# Patient Record
Sex: Female | Born: 1965 | Race: White | Hispanic: No | Marital: Married | Smoking: Never smoker
Health system: Southern US, Community
[De-identification: ages and names within clinical notes are randomized; demographics above are authoritative.]

---

## 2021-06-30 ENCOUNTER — Other Ambulatory Visit: Payer: Self-pay

## 2021-06-30 ENCOUNTER — Inpatient Hospital Stay (HOSPITAL_BASED_OUTPATIENT_CLINIC_OR_DEPARTMENT_OTHER)
Admission: EM | Admit: 2021-06-30 | Discharge: 2021-07-08 | DRG: 025 | Disposition: A | Discharge status: Home / Self Care | Payer: 59 | Attending: Neurological Surgery | Admitting: Neurological Surgery

## 2021-06-30 ENCOUNTER — Emergency Department (HOSPITAL_BASED_OUTPATIENT_CLINIC_OR_DEPARTMENT_OTHER): Payer: 59

## 2021-06-30 ENCOUNTER — Encounter (HOSPITAL_BASED_OUTPATIENT_CLINIC_OR_DEPARTMENT_OTHER): Payer: Self-pay

## 2021-06-30 DIAGNOSIS — G4452 New daily persistent headache (NDPH): Secondary | ICD-10-CM

## 2021-06-30 DIAGNOSIS — D496 Neoplasm of unspecified behavior of brain: Principal | ICD-10-CM | POA: Diagnosis present

## 2021-06-30 DIAGNOSIS — R4701 Aphasia: Secondary | ICD-10-CM | POA: Diagnosis present

## 2021-06-30 DIAGNOSIS — Z91040 Latex allergy status: Secondary | ICD-10-CM | POA: Diagnosis not present

## 2021-06-30 DIAGNOSIS — Z79890 Hormone replacement therapy (postmenopausal): Secondary | ICD-10-CM

## 2021-06-30 DIAGNOSIS — Z882 Allergy status to sulfonamides status: Secondary | ICD-10-CM | POA: Diagnosis not present

## 2021-06-30 DIAGNOSIS — E876 Hypokalemia: Secondary | ICD-10-CM | POA: Diagnosis not present

## 2021-06-30 DIAGNOSIS — G9389 Other specified disorders of brain: Principal | ICD-10-CM

## 2021-06-30 DIAGNOSIS — Z888 Allergy status to other drugs, medicaments and biological substances status: Secondary | ICD-10-CM | POA: Diagnosis not present

## 2021-06-30 DIAGNOSIS — Z880 Allergy status to penicillin: Secondary | ICD-10-CM | POA: Diagnosis not present

## 2021-06-30 DIAGNOSIS — G936 Cerebral edema: Secondary | ICD-10-CM | POA: Diagnosis not present

## 2021-06-30 DIAGNOSIS — H53461 Homonymous bilateral field defects, right side: Secondary | ICD-10-CM | POA: Diagnosis not present

## 2021-06-30 DIAGNOSIS — C719 Malignant neoplasm of brain, unspecified: Secondary | ICD-10-CM

## 2021-06-30 DIAGNOSIS — N390 Urinary tract infection, site not specified: Secondary | ICD-10-CM | POA: Diagnosis not present

## 2021-06-30 LAB — CBC WITH DIFFERENTIAL/PLATELET
Abs Immature Granulocytes: 0.02 10*3/uL (ref 0.00–0.07)
Basophils Absolute: 0.1 10*3/uL (ref 0.0–0.1)
Basophils Relative: 1 %
Eosinophils Absolute: 0.1 10*3/uL (ref 0.0–0.5)
Eosinophils Relative: 1 %
HCT: 39.2 % (ref 36.0–46.0)
Hemoglobin: 13 g/dL (ref 12.0–15.0)
Immature Granulocytes: 0 %
Lymphocytes Relative: 19 %
Lymphs Abs: 1.6 10*3/uL (ref 0.7–4.0)
MCH: 30.2 pg (ref 26.0–34.0)
MCHC: 33.2 g/dL (ref 30.0–36.0)
MCV: 91 fL (ref 80.0–100.0)
Monocytes Absolute: 0.6 10*3/uL (ref 0.1–1.0)
Monocytes Relative: 7 %
Neutro Abs: 5.8 10*3/uL (ref 1.7–7.7)
Neutrophils Relative %: 72 %
Platelets: 193 10*3/uL (ref 150–400)
RBC: 4.31 MIL/uL (ref 3.87–5.11)
RDW: 11.6 % (ref 11.5–15.5)
WBC: 8.1 10*3/uL (ref 4.0–10.5)
nRBC: 0 % (ref 0.0–0.2)

## 2021-06-30 LAB — BASIC METABOLIC PANEL
Anion gap: 11 (ref 5–15)
BUN: 17 mg/dL (ref 6–20)
CO2: 25 mmol/L (ref 22–32)
Calcium: 9.6 mg/dL (ref 8.9–10.3)
Chloride: 103 mmol/L (ref 98–111)
Creatinine, Ser: 0.83 mg/dL (ref 0.44–1.00)
GFR, Estimated: >60 mL/min (ref >60)
Glucose, Bld: 145 mg/dL — ABNORMAL HIGH (ref 70–99)
Potassium: 3.6 mmol/L (ref 3.5–5.1)
Sodium: 139 mmol/L (ref 135–145)

## 2021-06-30 IMAGING — CT CT HEAD W/O CM
4 series · 16 of 47 positions shown, 18 images · non-contrast
Comparison: None Available.

CLINICAL DATA: Neurologic deficit.



[Series 2: head wo · axial · 0.41mm/px · z∈[-123,-3]mm · 7 of 33 slices shown, 9 images]
[im 5/33  brain]
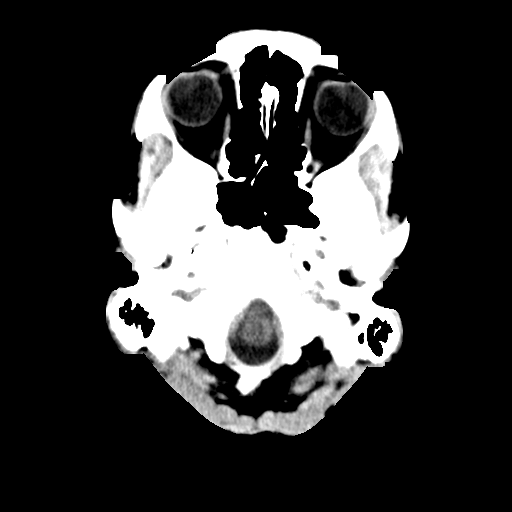
[im 5/33  bone]
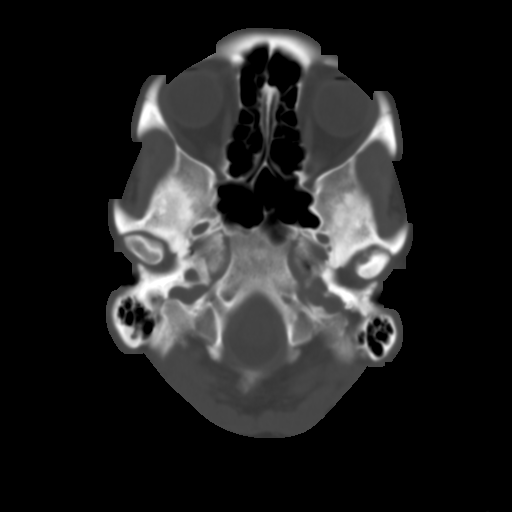
[im 9/33  brain]
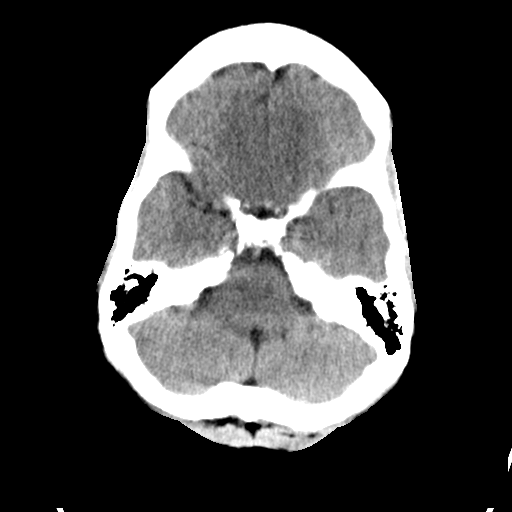
[im 13/33  brain]
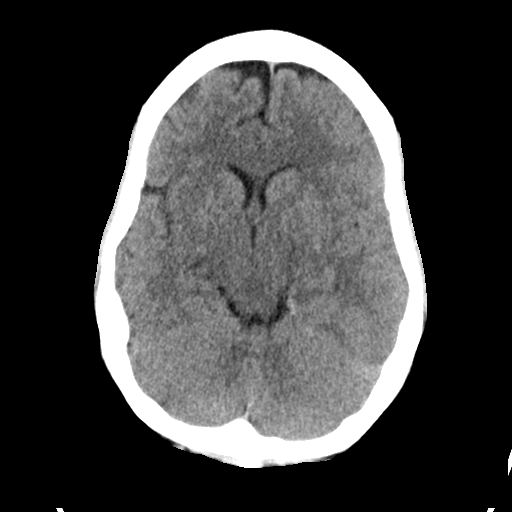
[im 17/33  brain]
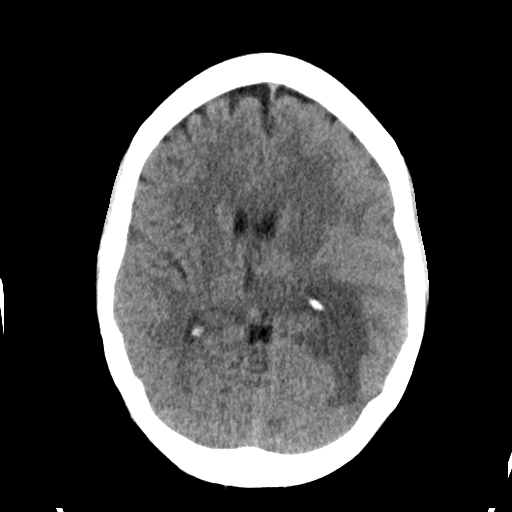
[im 21/33  brain]
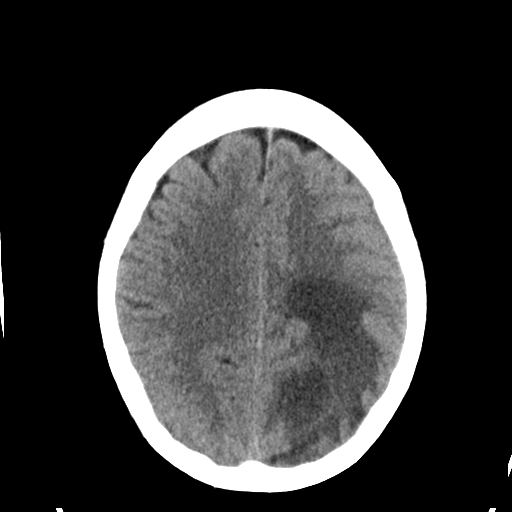
[im 21/33  bone]
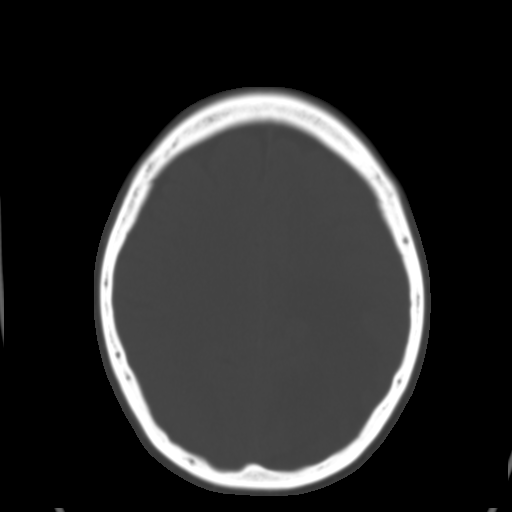
[im 25/33  brain]
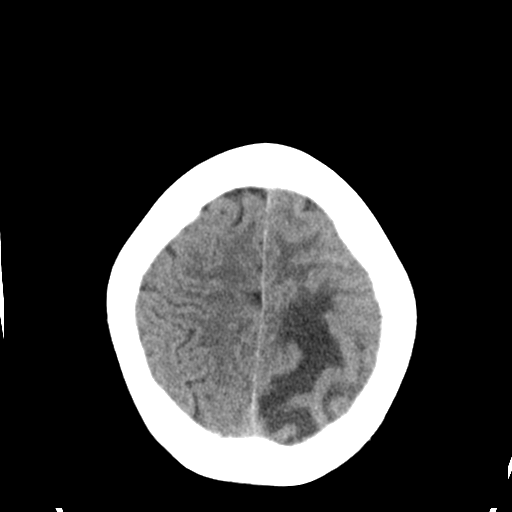
[im 29/33  brain]
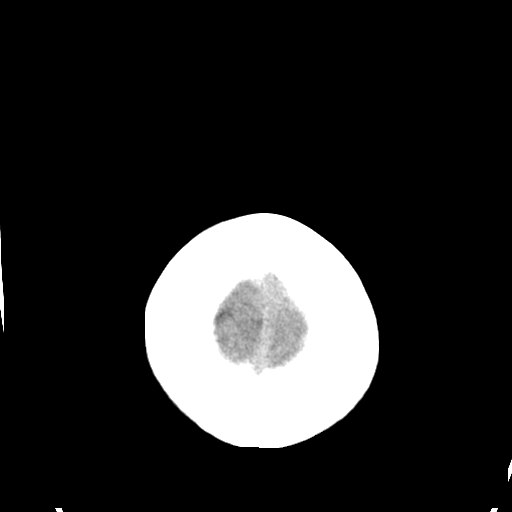

[Series 3: head bone · axial · 0.41mm/px · z∈[-127,-95]mm · 3 of 81 slices shown]
[im 9/81  bone]
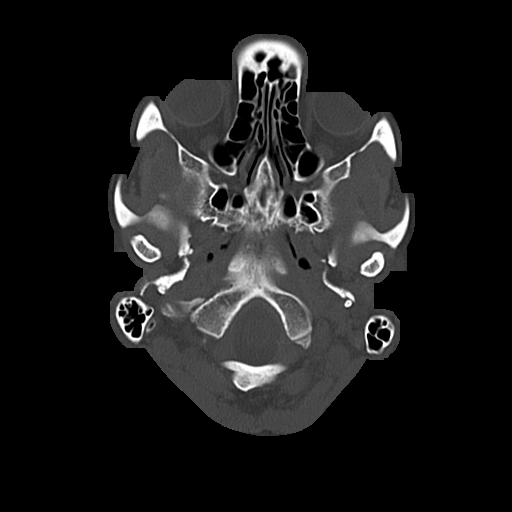
[im 17/81  bone]
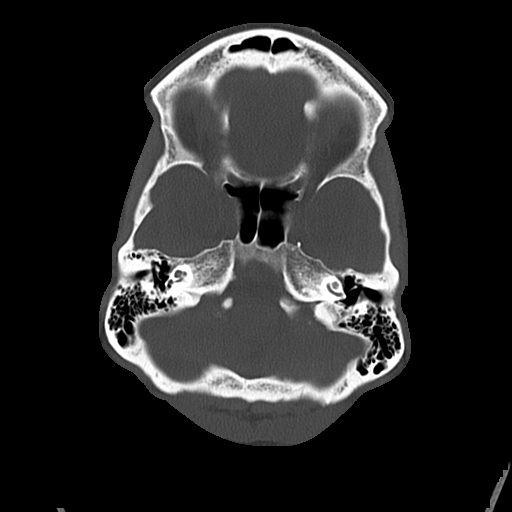
[im 25/81  bone]
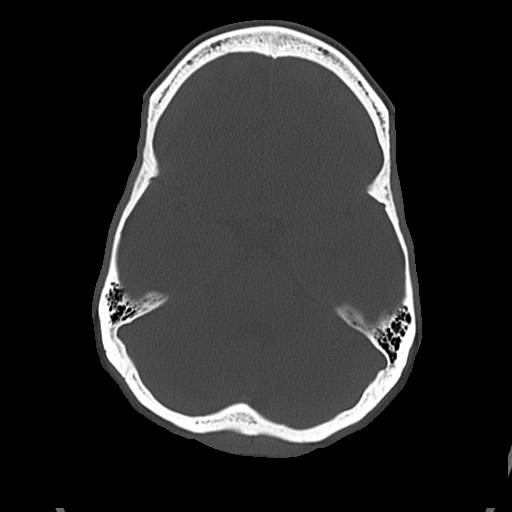

[Series 4: coronal soft · coronal · 0.29mm/px · 3 of 64 slices shown]
[im 22/64  brain]
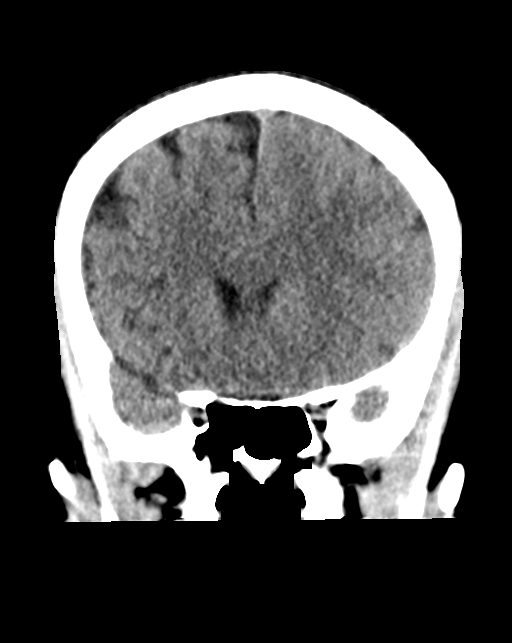
[im 29/64  brain]
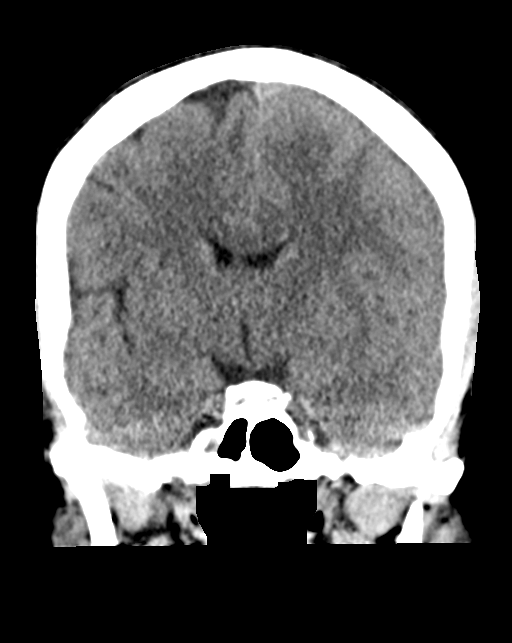
[im 36/64  brain]
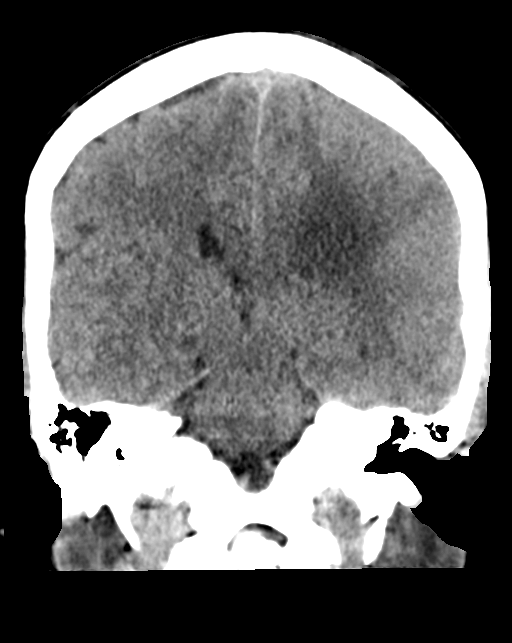

[Series 5: sagittal soft · sagittal · 0.36mm/px · 3 of 50 slices shown]
[im 17/50  brain]
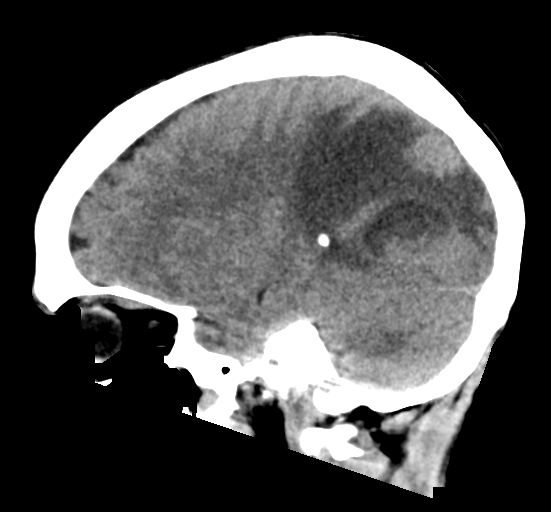
[im 25/50  brain]
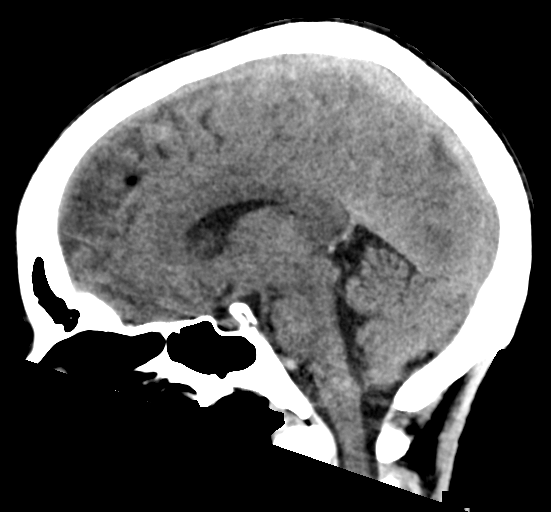
[im 33/50  brain]
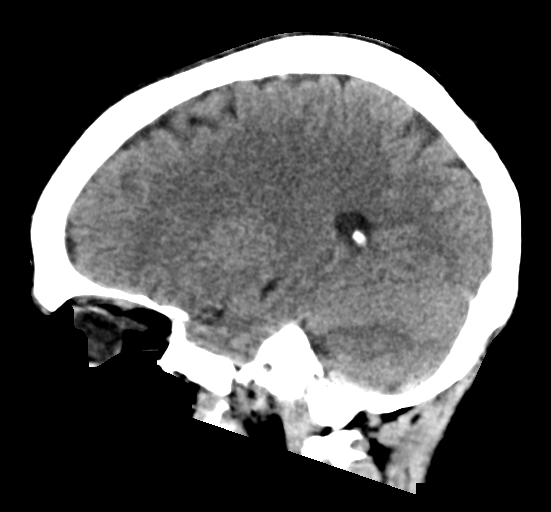

[16 of 47 positions shown; findings below may reference images not displayed]

FINDINGS: Brain: There is a large area of vasogenic edema involving the left
posterior frontal, parietal, and occipital lobes. And ill-defined
low attenuating rounded lesion in the medial left occipital lobe
([DATE]) suspicious for a mass. Further characterization with MRI
without and with contrast is recommended. There is associated mild
mass effect and compression of the left lateral ventricle and
adjacent sulci. There is approximately 3 mm left right midline
shift. There is no acute intracranial hemorrhage.

Vascular: No hyperdense vessel or unexpected calcification.

Skull: Normal. Negative for fracture or focal lesion.

Sinuses/Orbits: No acute finding.

Other: None
IMPRESSION: 1. No acute intracranial hemorrhage.
2. Large left vasogenic edema with associated mass effect and 3 mm
left-to-right midline shift and findings concerning for underlying
neoplasm. Further characterization with MRI without and with
contrast is recommended.

These results were called by telephone at the time of interpretation
on [DATE] at [DATE] to provider RANTONA , who verbally
acknowledged these results.

## 2021-06-30 MED ORDER — DEXAMETHASONE SODIUM PHOSPHATE 10 MG/ML IJ SOLN
10.0000 mg | Freq: Once | INTRAMUSCULAR | Status: AC
  Administered 2021-06-30: 10 mg via INTRAVENOUS
  Filled 2021-06-30: qty 1

## 2021-06-30 NOTE — Progress Notes (Signed)
We were paged to look at the CT scan on this 56 year old female who presented with a 2-week history of headaches.  I have called the #3 times with no answer and cannot leave a message because the mailbox has not been set up.  Addendum: Dr Almyra Free just called me back and we discussed this patient ? ?CT scan shows edema in the left occipital lobe consistent with mass lesion.  Suspect primary brain tumor versus metastatic lesion.  An MRI has been ordered but I do not think they can do that at that facility. ? ?I recommend transfer to [facility] for MRI of the brain with and without contrast, admission to the medicine service for further work-up and management.  10 mg of Decadron followed by 4 mg of Decadron every 6 hours.  This may help cerebral edema somewhat.  If the MRI looks like GBM then no metastatic work-up needed, if the MRI looks like brain metastasis then metastatic work-up will be needed. ?

## 2021-06-30 NOTE — ED Triage Notes (Addendum)
Pt presents to the ED with husband. Reports having a sharp pain in her head two weeks ago. Since she reports having issues reading and writing. Reports occasionally stumbling. EKG obtained. MD evaluated pt in triage. [facility] 1. ?

## 2021-06-30 NOTE — ED Provider Notes (Signed)
?[facility] ?Provider Note ? ? ?CSN: [id number] ?Arrival date & time: 06/30/21  1911 ? ?  ? ?History ? ?Chief Complaint  ?Patient presents with  ? Aphasia  ? ? ?Debbie Horton is a 56 y.o. female. ? ?Presents to ER chief complaint of being a bit "off" for the past week or 2.  She found she was stumbling at times, had some difficulty spelling out certain words.  Otherwise denies any specific weakness.  Denies any actual speaking difficulty.  However symptoms of gotten worse in the last 3 days.  Coworkers noticed that something was off about her and advised her to go to the ER.  Patient otherwise denies any other medical history.  She is on a hormone replacement therapy with her OB/GYN physician, otherwise does not have a regular primary care doctor. ? ? ?  ? ?Home Medications ?Prior to Admission medications   ?Not on File  ?   ? ?Allergies    ?Penicillins, Phenergan [promethazine], and Sulfa antibiotics   ? ?Review of Systems   ?Review of Systems  ?Constitutional:  Negative for fever.  ?HENT:  Negative for ear pain.   ?Eyes:  Negative for pain.  ?Respiratory:  Negative for cough.   ?Cardiovascular:  Negative for chest pain.  ?Gastrointestinal:  Negative for abdominal pain.  ?Genitourinary:  Negative for flank pain.  ?Musculoskeletal:  Negative for back pain.  ?Skin:  Negative for rash.  ?Neurological:  Negative for headaches.  ? ?Physical Exam ?Updated Vital Signs ?BP 124/78 (BP Location: Right Arm)   Pulse (!) 104   Temp 98.9 ?F (37.2 ?C)   Resp 18   Ht '5\' 3"'$  (1.6 m)   Wt 59 kg   SpO2 100%   BMI 23.03 kg/m?  ?Physical Exam ?Constitutional:   ?   General: She is not in acute distress. ?   Appearance: Normal appearance.  ?HENT:  ?   Head: Normocephalic.  ?   Nose: Nose normal.  ?Eyes:  ?   Extraocular Movements: Extraocular movements intact.  ?Cardiovascular:  ?   Rate and Rhythm: Normal rate.  ?Pulmonary:  ?   Effort: Pulmonary effort is normal.  ?Musculoskeletal:     ?   General:  Normal range of motion.  ?   Cervical back: Normal range of motion.  ?Neurological:  ?   Mental Status: She is alert and oriented to person, place, and time.  ?   Comments: Patient is awake and alert and oriented answers all my questions appropriately speech pattern is normal no facial droop noted. ? ?Strength is 5/5 all extremities. ? ?Patient is able to walk unassisted.  Has a mildly unstable gait. ? ?On visual field exam she has right-sided visual field neglect.  ? ? ?ED Results / Procedures / Treatments   ?Labs ?(all labs ordered are listed, but only abnormal results are displayed) ?Labs Reviewed  ?BASIC METABOLIC PANEL - Abnormal; Notable for the following components:  ?    Result Value  ? Glucose, Bld 145 (*)   ? All other components within normal limits  ?CBC WITH DIFFERENTIAL/PLATELET  ? ? ?EKG ?EKG Interpretation ? ?Date/Time:  Wednesday Jun 30 2021 19:38:09 EDT ?Ventricular Rate:  97 ?PR Interval:  126 ?QRS Duration: 70 ?QT Interval:  318 ?QTC Calculation: 403 ?R Axis:   69 ?Text Interpretation: Normal sinus rhythm Right atrial enlargement Nonspecific ST and T wave abnormality Abnormal ECG No previous ECGs available Confirmed by Thamas Jaegers ([id number]) on 06/30/2021 7:50:25 PM ? ?  Radiology ?CT Head Wo Contrast ? ?Result Date: 06/30/2021 ?CLINICAL DATA:  Neurologic deficit. EXAM: CT HEAD WITHOUT CONTRAST TECHNIQUE: Contiguous axial images were obtained from the base of the skull through the vertex without intravenous contrast. RADIATION DOSE REDUCTION: This exam was performed according to the departmental dose-optimization program which includes automated exposure control, adjustment of the mA and/or kV according to patient size and/or use of iterative reconstruction technique. COMPARISON:  None Available. FINDINGS: Brain: There is a large area of vasogenic edema involving the left posterior frontal, parietal, and occipital lobes. And ill-defined low attenuating rounded lesion in the medial left occipital lobe  (21/2) suspicious for a mass. Further characterization with MRI without and with contrast is recommended. There is associated mild mass effect and compression of the left lateral ventricle and adjacent sulci. There is approximately 3 mm left right midline shift. There is no acute intracranial hemorrhage. Vascular: No hyperdense vessel or unexpected calcification. Skull: Normal. Negative for fracture or focal lesion. Sinuses/Orbits: No acute finding. Other: None IMPRESSION: 1. No acute intracranial hemorrhage. 2. Large left vasogenic edema with associated mass effect and 3 mm left-to-right midline shift and findings concerning for underlying neoplasm. Further characterization with MRI without and with contrast is recommended. These results were called by telephone at the time of interpretation on 06/30/2021 at 8:27 pm to provider [facility] , who verbally acknowledged these results. Electronically Signed   By: Anner Crete M.D.   On: 06/30/2021 20:28   ? ?Procedures ?Procedures  ? ? ?Medications Ordered in ED ?Medications  ?dexamethasone (DECADRON) injection 10 mg (has no administration in time range)  ? ? ?ED Course/ Medical Decision Making/ A&P ?  ?                        ?Medical Decision Making ?Amount and/or Complexity of Data Reviewed ?Labs: ordered. ?Radiology: ordered. ? ?Risk ?Prescription drug management. ? ? ?No additional medical records found. ? ?Cardiac monitoring showing sinus rhythm. ? ?History obtained from husband at bedside. ? ?Work-up includes labs which were unremarkable.  However CT scan of the brain concerning for large amount of vasogenic edema in the left side of the brain with underlying mass.  There is a 3 to 5 mm midline shift per radiologist.  Case discussed with radiology. ? ?Case also discussed with on-call neurosurgeon Dr.Jones recommending Decadron 10 mg now and admission to the hospitalist team. ? ?MRI with and without of the brain ordered and pending transfer to  hospital. ? ?Case discussed with medicine for admission. ? ? ? ? ? ? ? ?Final Clinical Impression(s) / ED Diagnoses ?Final diagnoses:  ?Brain mass  ? ? ?Rx / DC Orders ?ED Discharge Orders   ? ? None  ? ?  ? ? ?  ?Luna Fuse, MD ?06/30/21 2131 ? ?

## 2021-07-01 ENCOUNTER — Observation Stay (HOSPITAL_COMMUNITY): Payer: 59

## 2021-07-01 DIAGNOSIS — Z91040 Latex allergy status: Secondary | ICD-10-CM | POA: Diagnosis not present

## 2021-07-01 DIAGNOSIS — R4701 Aphasia: Secondary | ICD-10-CM | POA: Diagnosis present

## 2021-07-01 DIAGNOSIS — Z79890 Hormone replacement therapy (postmenopausal): Secondary | ICD-10-CM | POA: Diagnosis not present

## 2021-07-01 DIAGNOSIS — Z888 Allergy status to other drugs, medicaments and biological substances status: Secondary | ICD-10-CM | POA: Diagnosis not present

## 2021-07-01 DIAGNOSIS — N390 Urinary tract infection, site not specified: Secondary | ICD-10-CM | POA: Diagnosis present

## 2021-07-01 DIAGNOSIS — G9389 Other specified disorders of brain: Secondary | ICD-10-CM

## 2021-07-01 DIAGNOSIS — Z882 Allergy status to sulfonamides status: Secondary | ICD-10-CM | POA: Diagnosis not present

## 2021-07-01 DIAGNOSIS — H53461 Homonymous bilateral field defects, right side: Secondary | ICD-10-CM | POA: Diagnosis present

## 2021-07-01 DIAGNOSIS — D496 Neoplasm of unspecified behavior of brain: Secondary | ICD-10-CM | POA: Diagnosis not present

## 2021-07-01 DIAGNOSIS — Z880 Allergy status to penicillin: Secondary | ICD-10-CM | POA: Diagnosis not present

## 2021-07-01 DIAGNOSIS — E876 Hypokalemia: Secondary | ICD-10-CM | POA: Diagnosis present

## 2021-07-01 DIAGNOSIS — G936 Cerebral edema: Secondary | ICD-10-CM | POA: Diagnosis present

## 2021-07-01 LAB — BASIC METABOLIC PANEL
Anion gap: 7 (ref 5–15)
BUN: 11 mg/dL (ref 6–20)
CO2: 24 mmol/L (ref 22–32)
Calcium: 9.2 mg/dL (ref 8.9–10.3)
Chloride: 108 mmol/L (ref 98–111)
Creatinine, Ser: 0.76 mg/dL (ref 0.44–1.00)
GFR, Estimated: >60 mL/min (ref >60)
Glucose, Bld: 151 mg/dL — ABNORMAL HIGH (ref 70–99)
Potassium: 3.4 mmol/L — ABNORMAL LOW (ref 3.5–5.1)
Sodium: 139 mmol/L (ref 135–145)

## 2021-07-01 LAB — CBC
HCT: 39.8 % (ref 36.0–46.0)
Hemoglobin: 13.3 g/dL (ref 12.0–15.0)
MCH: 30.5 pg (ref 26.0–34.0)
MCHC: 33.4 g/dL (ref 30.0–36.0)
MCV: 91.3 fL (ref 80.0–100.0)
Platelets: 194 10*3/uL (ref 150–400)
RBC: 4.36 MIL/uL (ref 3.87–5.11)
RDW: 11.5 % (ref 11.5–15.5)
WBC: 8.2 10*3/uL (ref 4.0–10.5)
nRBC: 0 % (ref 0.0–0.2)

## 2021-07-01 LAB — HIV ANTIBODY (ROUTINE TESTING W REFLEX): HIV Screen 4th Generation wRfx: NONREACTIVE

## 2021-07-01 IMAGING — MR MR HEAD WO/W CM
16 of 19 series · 36 of 48 positions shown · IV contrast (gadavist)
Comparison: Head CT [DATE]

CLINICAL DATA: Intracranial neoplasm

EXAM:
MRI HEAD WITHOUT AND WITH CONTRAST
TECHNIQUE: Multiplanar, multiecho pulse sequences of the brain and surrounding
structures were obtained without and with intravenous contrast.
CONTRAST:  6mL GADAVIST GADOBUTROL 1 MMOL/ML IV SOLN

[Series 5: DWI · axial · 3.0mm · 0.88mm/px · z∈[-107,+54]mm · 5 of 113 slices shown (1 of 4)]
[im 1/113]
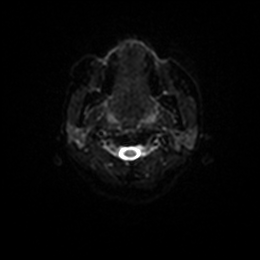
[im 29/113]
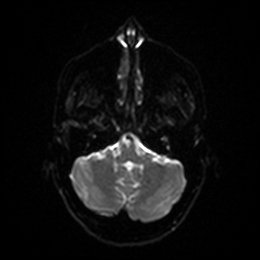
[im 57/113]
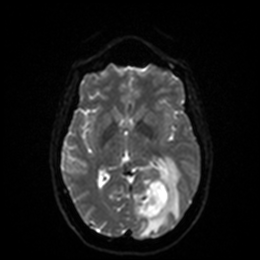
[im 85/113]
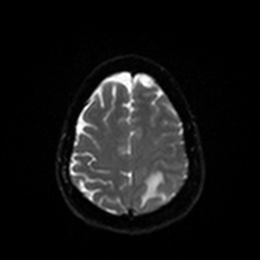
[im 113/113]
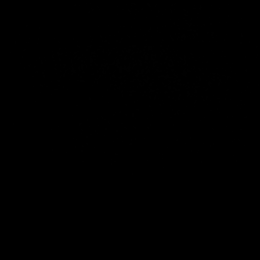

[Series 6: DWI · axial · 3.0mm · 0.88mm/px · z∈[-107,+51]mm · 2 of 56 slices shown (2 of 4)]
[im 1/56]
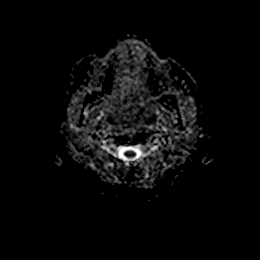
[im 56/56]
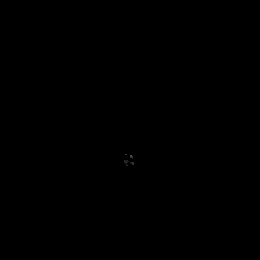

[Series 7: DWI · coronal · 4.0mm · 0.88mm/px · 3 of 80 slices shown (3 of 4)]
[im 1/80]
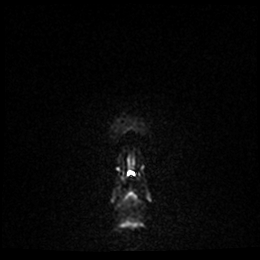
[im 40/80]
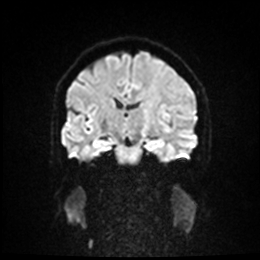
[im 80/80]
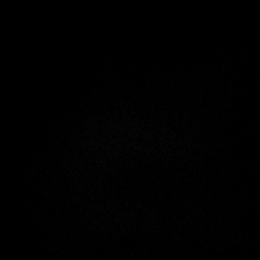

[Series 8: DWI · coronal · 4.0mm · 0.88mm/px · 2 of 39 slices shown (4 of 4)]
[im 1/39]
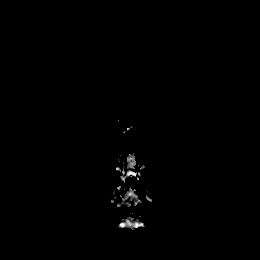
[im 39/39]
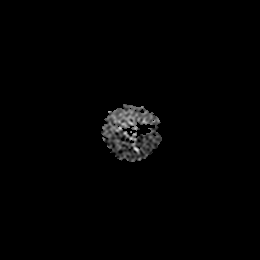

[Series 9: T1 · sagittal · 5.0mm · 0.75mm/px · 1 of 27 slices shown]
[im 1/27]
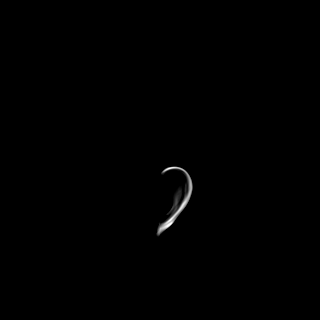

[Series 10: T2 · axial · 5.0mm · 0.72mm/px · 1 of 29 slices shown]
[im 1/29]
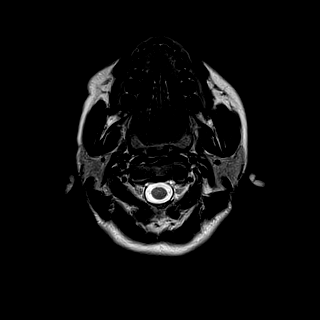

[Series 11: FLAIR · axial · 5.0mm · 0.45mm/px · 1 of 29 slices shown]
[im 1/29]
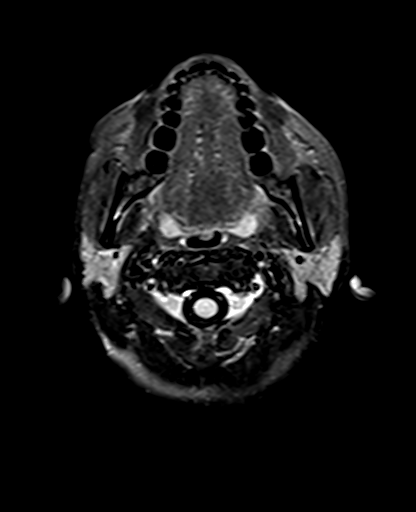

[Series 12: mag_images · axial · 3.0mm · 0.90mm/px · z∈[-103,+55]mm · 2 of 56 slices shown]
[im 1/56]
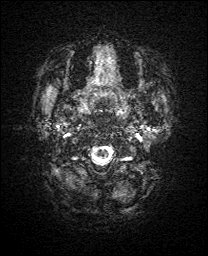
[im 56/56]
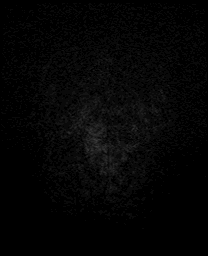

[Series 13: pha_images · axial · 3.0mm · 0.90mm/px · z∈[-103,+49]mm · 2 of 53 slices shown]
[im 1/53]
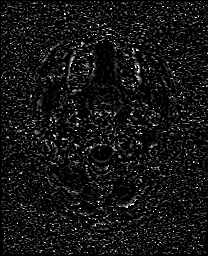
[im 53/53]
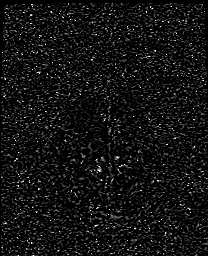

[Series 14: swi_images · axial · 3.0mm · 0.90mm/px · z∈[-103,+55]mm · 2 of 56 slices shown]
[im 1/56]
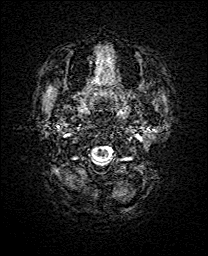
[im 56/56]
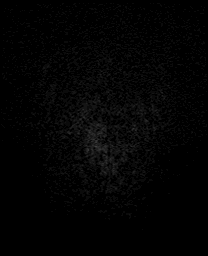

[Series 15: mip_images(sw) · axial · 24.0mm · 0.90mm/px · z∈[-93,+45]mm · 2 of 49 slices shown]
[im 1/49]
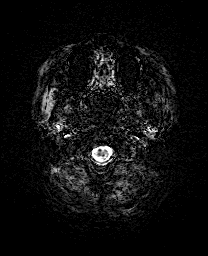
[im 49/49]
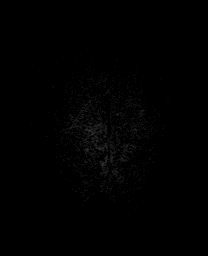

[Series 17: t2_space_dark-fluid_sag_p2_ns-ir · sagittal · 1.0mm · 0.49mm/px · 8 of 192 slices shown]
[im 1/192]
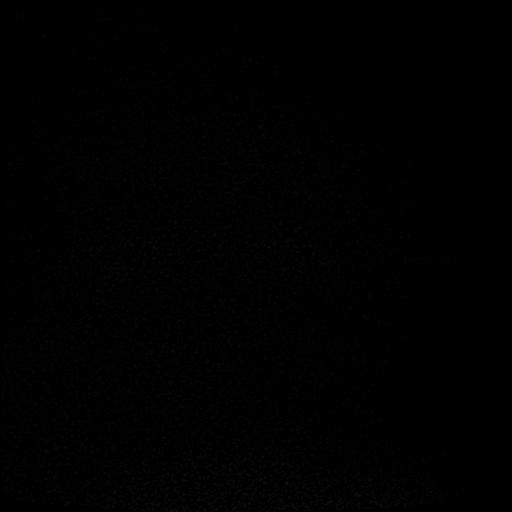
[im 28/192]
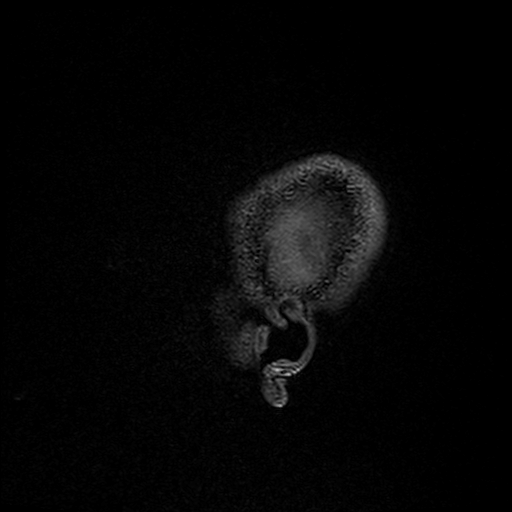
[im 55/192]
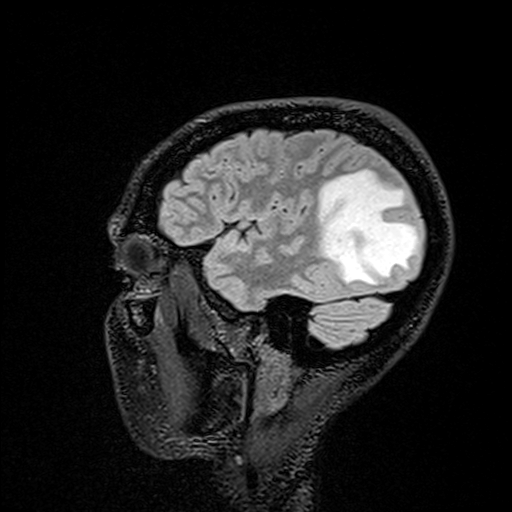
[im 82/192]
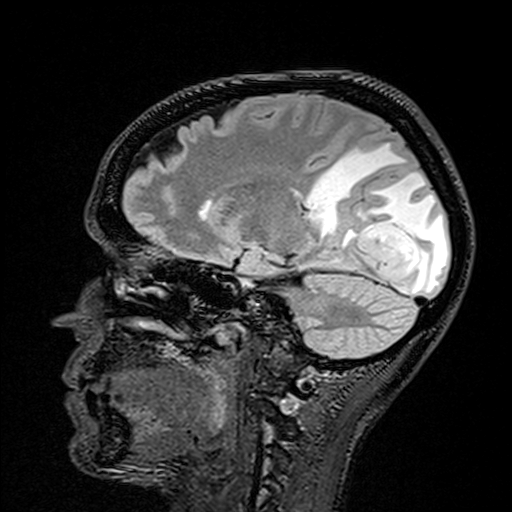
[im 110/192]
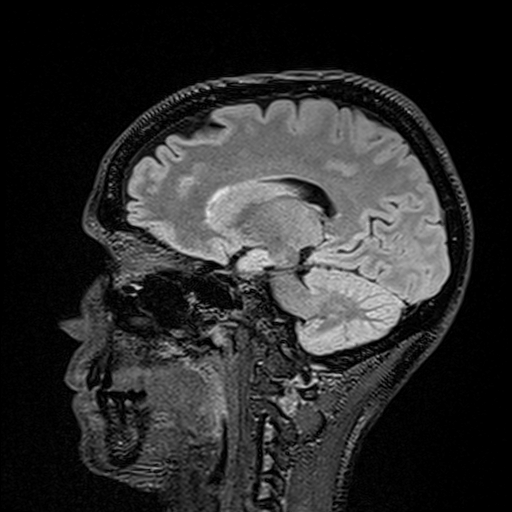
[im 137/192]
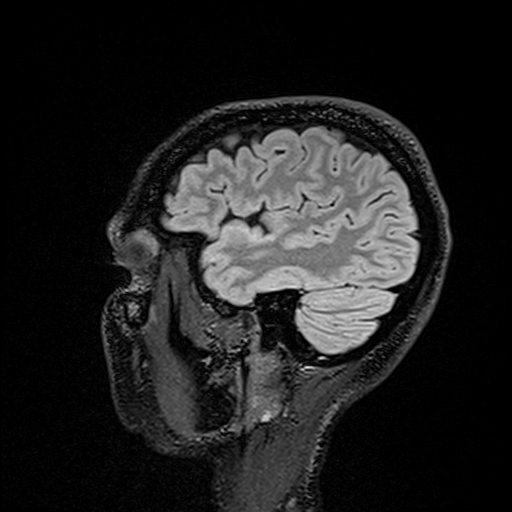
[im 164/192]
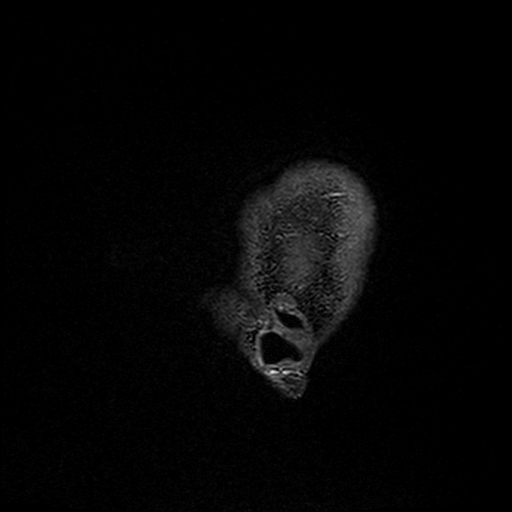
[im 192/192]
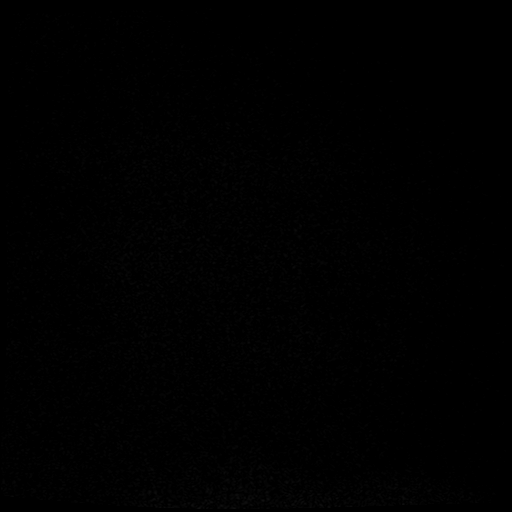

[Series 18: t2_space_dark-fluid_sag_p2_ns-ir_mpr_ axial · axial · 1.0mm · 0.45mm/px · z∈[-82,-53]mm · 2 of 123 slices shown]
[im 1/123]
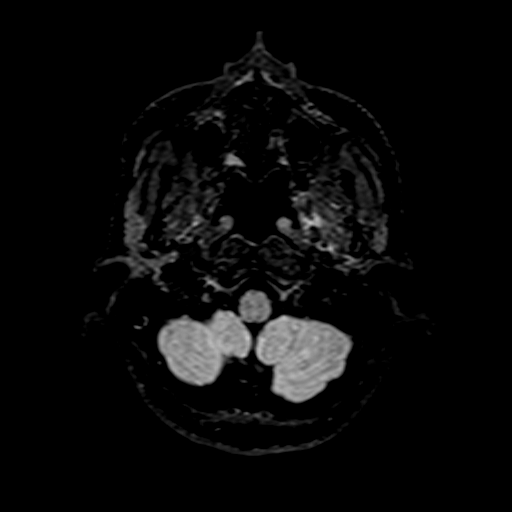
[im 31/123]
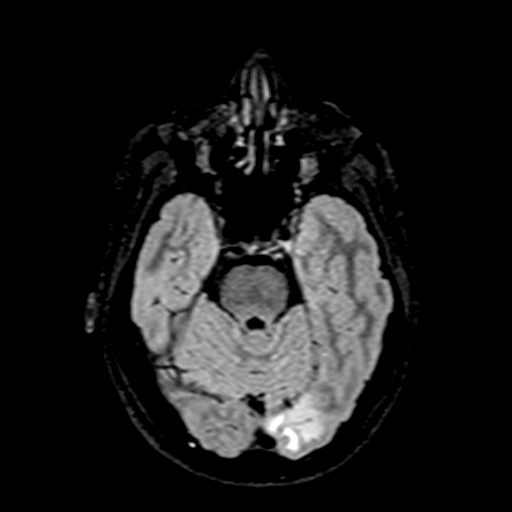

[Series 20: T2 post-contrast · coronal · 5.0mm · 0.72mm/px · 1 of 33 slices shown]
[im 1/33]
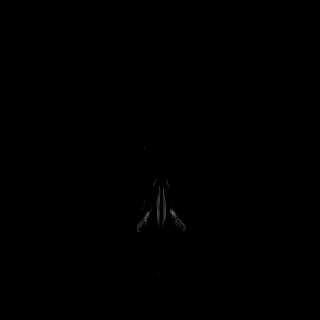

[Series 22: T1 post-contrast · coronal · 5.0mm · 0.34mm/px · 1 of 33 slices shown (1 of 2)]
[im 1/33]
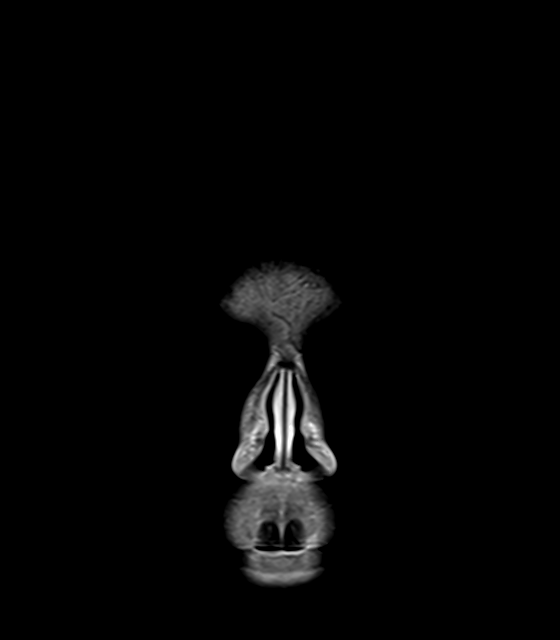

[Series 23: T1 post-contrast · sagittal · 5.0mm · 0.72mm/px · 1 of 27 slices shown (2 of 2)]
[im 1/27]
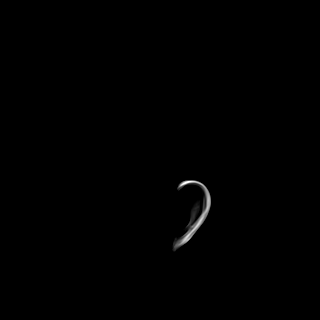

[36 of 48 positions shown; findings below may reference images not displayed]

FINDINGS: Brain: Mass within the posterior left hemisphere measures 3.8 x
cm. Most of the central component is necrotic/cystic. There is
mildly reduced diffusivity within the solid component. There is
petechial hemorrhage within the lesion. There is a large amount of
surrounding edema with rightward midline shift of 3 mm. The abnormal
T2-weighted signal extends to the splenium of the corpus callosum.

There are no other contrast-enhancing lesions.  No hydrocephalus.

Vascular: Normal flow voids.

Skull and upper cervical spine: Normal marrow signal.

Sinuses/Orbits: Negative.

Other: None.
IMPRESSION: Mass within the posterior left hemisphere with large amount of
surrounding edema and 3 mm of rightward midline shift. This is most
consistent with a high grade glioma.

## 2021-07-01 MED ORDER — ACETAMINOPHEN 325 MG PO TABS
650.0000 mg | ORAL_TABLET | Freq: Four times a day (QID) | ORAL | Status: DC | PRN
  Administered 2021-07-01 – 2021-07-08 (×5): 650 mg via ORAL
  Filled 2021-07-01 (×6): qty 2

## 2021-07-01 MED ORDER — ACETAMINOPHEN 650 MG RE SUPP: 650.0000 mg | Freq: Four times a day (QID) | RECTAL | Status: DC | PRN

## 2021-07-01 MED ORDER — GADOBUTROL 1 MMOL/ML IV SOLN
6.0000 mL | Freq: Once | INTRAVENOUS | Status: AC | PRN
  Administered 2021-07-01: 6 mL via INTRAVENOUS

## 2021-07-01 MED ORDER — SODIUM CHLORIDE 0.9% FLUSH
3.0000 mL | Freq: Two times a day (BID) | INTRAVENOUS | Status: DC
  Administered 2021-07-01 – 2021-07-08 (×15): 3 mL via INTRAVENOUS

## 2021-07-01 MED ORDER — POTASSIUM CHLORIDE CRYS ER 20 MEQ PO TBCR
40.0000 meq | EXTENDED_RELEASE_TABLET | Freq: Every day | ORAL | Status: DC
Start: 2021-07-01 — End: 2021-07-08
  Administered 2021-07-01 – 2021-07-08 (×8): 40 meq via ORAL
  Filled 2021-07-01 (×7): qty 2

## 2021-07-01 MED ORDER — DEXAMETHASONE SODIUM PHOSPHATE 4 MG/ML IJ SOLN
4.0000 mg | Freq: Four times a day (QID) | INTRAMUSCULAR | Status: DC
  Administered 2021-07-01 – 2021-07-07 (×24): 4 mg via INTRAVENOUS
  Filled 2021-07-01 (×24): qty 1

## 2021-07-01 NOTE — Progress Notes (Addendum)
PROGRESS NOTE        PATIENT DETAILS Name: Debbie Horton Age: 56 y.o. Sex: female Date of Birth: 11/11/1965 Admit Date: 06/30/2021 Admitting Physician Kayleen Memos, DO PCP:Pcp, No  Brief Summary: Patient is a 56 y.o.  female with no past medical history-presented with 2-3-week history of headaches and some intermittent nausea.  Neuroimaging positive for left brain mass-suspicious for GBM.   Significant events: 5/17>> admit to [facility]-neuroimaging with left posterior cerebral mass with edema.  Significant studies: 5/17>> CT head: Large area of vasogenic edema involving left posterior frontal/parietal and occipital lobe-ill-defined rounded lesion in the medial left occipital lobe. 5/18>> MRI brain: Mass within posterior left hemisphere with large amount of surrounding edema-3 mm rightward midline shift.  Significant microbiology data:   Procedures:   Consults: Neurosurgery  Subjective: Lying comfortably in bed-denies any chest pain or shortness of breath.  Headaches somewhat better.  Objective: Vitals: Blood pressure 107/64, pulse 78, temperature 98.7 F (37.1 C), temperature source Oral, resp. rate 18, height '5\' 3"'$  (1.6 m), weight 59 kg, SpO2 98 %.   Exam: Gen Exam:Alert awake-not in any distress HEENT:atraumatic, normocephalic Chest: B/L clear to auscultation anteriorly CVS:S1S2 regular Abdomen:soft non tender, non distended Extremities:no edema Neurology: Non focal Skin: no rash  Pertinent Labs/Radiology:    Latest Ref Rng & Units 07/01/2021    3:28 AM 06/30/2021    7:32 PM  CBC  WBC 4.0 - 10.5 K/uL 8.2   8.1    Hemoglobin 12.0 - 15.0 g/dL 13.3   13.0    Hematocrit 36.0 - 46.0 % 39.8   39.2    Platelets 150 - 400 K/uL 194   193      Lab Results  Component Value Date   NA 139 07/01/2021   K 3.4 (L) 07/01/2021   CL 108 07/01/2021   CO2 24 07/01/2021      Assessment/Plan: Left posterior cerebral hemisphere mass with  surrounding edema and rightward midline shift 3 mm: Suspicious for high-grade glioma-on IV steroids-headaches better-no focal deficits-awaiting further recommendations from neurosurgery.  Hypokalemia: Replete and recheck.  BMI: Estimated body mass index is 23.03 kg/m as calculated from the following:   Height as of this encounter: '5\' 3"'$  (1.6 m).   Weight as of this encounter: 59 kg.   Code status:   Code Status: Full Code   DVT Prophylaxis: SCDs Start: 07/01/21 0031   Family Communication:Spouse at bedside   Disposition Plan: Status is: Observation The patient will require care spanning > 2 midnights and should be moved to inpatient because: Brain mass with edema/3 mm midline shift-on IV steroids-awaiting recommendations from neurosurgery.   Planned Discharge Destination:Home hopefully in the next 1-2 days.  Pending on neurosurgical opinion.   Diet: Diet Order             Diet regular Room service appropriate? Yes; Fluid consistency: Thin  Diet effective now                     Antimicrobial agents: Anti-infectives (From admission, onward)    None        MEDICATIONS: Scheduled Meds:  dexamethasone (DECADRON) injection  4 mg Intravenous Q6H   potassium chloride  40 mEq Oral Daily   sodium chloride flush  3 mL Intravenous Q12H   Continuous Infusions: PRN Meds:.acetaminophen **OR** acetaminophen   I  have personally reviewed following labs and imaging studies  LABORATORY DATA: CBC: Recent Labs  Lab 06/30/21 1932 07/01/21 0328  WBC 8.1 8.2  NEUTROABS 5.8  --   HGB 13.0 13.3  HCT 39.2 39.8  MCV 91.0 91.3  PLT 193 947    Basic Metabolic Panel: Recent Labs  Lab 06/30/21 1932 07/01/21 0328  NA 139 139  K 3.6 3.4*  CL 103 108  CO2 25 24  GLUCOSE 145* 151*  BUN 17 11  CREATININE 0.83 0.76  CALCIUM 9.6 9.2    GFR: Estimated Creatinine Clearance: 67.3 mL/min (by C-G formula based on SCr of 0.76 mg/dL).  Liver Function Tests: No results  for input(s): AST, ALT, ALKPHOS, BILITOT, PROT, ALBUMIN in the last 168 hours. No results for input(s): LIPASE, AMYLASE in the last 168 hours. No results for input(s): AMMONIA in the last 168 hours.  Coagulation Profile: No results for input(s): INR, PROTIME in the last 168 hours.  Cardiac Enzymes: No results for input(s): CKTOTAL, CKMB, CKMBINDEX, TROPONINI in the last 168 hours.  BNP (last 3 results) No results for input(s): PROBNP in the last 8760 hours.  Lipid Profile: No results for input(s): CHOL, HDL, LDLCALC, TRIG, CHOLHDL, LDLDIRECT in the last 72 hours.  Thyroid Function Tests: No results for input(s): TSH, T4TOTAL, FREET4, T3FREE, THYROIDAB in the last 72 hours.  Anemia Panel: No results for input(s): VITAMINB12, FOLATE, FERRITIN, TIBC, IRON, RETICCTPCT in the last 72 hours.  Urine analysis: No results found for: COLORURINE, APPEARANCEUR, LABSPEC, PHURINE, GLUCOSEU, HGBUR, BILIRUBINUR, KETONESUR, PROTEINUR, UROBILINOGEN, NITRITE, LEUKOCYTESUR  Sepsis Labs: Lactic Acid, Venous No results found for: LATICACIDVEN  MICROBIOLOGY: No results found for this or any previous visit (from the past 240 hour(s)).  RADIOLOGY STUDIES/RESULTS: CT Head Wo Contrast  Result Date: 06/30/2021 CLINICAL DATA:  Neurologic deficit. EXAM: CT HEAD WITHOUT CONTRAST TECHNIQUE: Contiguous axial images were obtained from the base of the skull through the vertex without intravenous contrast. RADIATION DOSE REDUCTION: This exam was performed according to the departmental dose-optimization program which includes automated exposure control, adjustment of the mA and/or kV according to patient size and/or use of iterative reconstruction technique. COMPARISON:  None Available. FINDINGS: Brain: There is a large area of vasogenic edema involving the left posterior frontal, parietal, and occipital lobes. And ill-defined low attenuating rounded lesion in the medial left occipital lobe (21/2) suspicious for a  mass. Further characterization with MRI without and with contrast is recommended. There is associated mild mass effect and compression of the left lateral ventricle and adjacent sulci. There is approximately 3 mm left right midline shift. There is no acute intracranial hemorrhage. Vascular: No hyperdense vessel or unexpected calcification. Skull: Normal. Negative for fracture or focal lesion. Sinuses/Orbits: No acute finding. Other: None IMPRESSION: 1. No acute intracranial hemorrhage. 2. Large left vasogenic edema with associated mass effect and 3 mm left-to-right midline shift and findings concerning for underlying neoplasm. Further characterization with MRI without and with contrast is recommended. These results were called by telephone at the time of interpretation on 06/30/2021 at 8:27 pm to provider [facility] , who verbally acknowledged these results. Electronically Signed   By: Anner Crete M.D.   On: 06/30/2021 20:28   MR Brain W and Wo Contrast  Result Date: 07/01/2021 CLINICAL DATA:  Intracranial neoplasm EXAM: MRI HEAD WITHOUT AND WITH CONTRAST TECHNIQUE: Multiplanar, multiecho pulse sequences of the brain and surrounding structures were obtained without and with intravenous contrast. CONTRAST:  64m GADAVIST GADOBUTROL 1 MMOL/ML IV SOLN  COMPARISON:  Head CT 06/30/2021 FINDINGS: Brain: Mass within the posterior left hemisphere measures 3.8 x 2.6 cm. Most of the central component is necrotic/cystic. There is mildly reduced diffusivity within the solid component. There is petechial hemorrhage within the lesion. There is a large amount of surrounding edema with rightward midline shift of 3 mm. The abnormal T2-weighted signal extends to the splenium of the corpus callosum. There are no other contrast-enhancing lesions.  No hydrocephalus. Vascular: Normal flow voids. Skull and upper cervical spine: Normal marrow signal. Sinuses/Orbits: Negative. Other: None. IMPRESSION: Mass within the posterior left  hemisphere with large amount of surrounding edema and 3 mm of rightward midline shift. 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 Electronically Signed   By: Ulyses Jarred M.D.   On: 07/01/2021 02:25     LOS: 0 days   Oren Binet, MD  [facility]    To contact the attending provider between 7A-7P or the covering provider during after hours 7P-7A, please log into the web site [email or web address] and access using universal [place] password for that web site. If you do not have the password, please call the hospital operator.  07/01/2021, 9:16 AM

## 2021-07-01 NOTE — H&P (Signed)
History and Physical   Debbie Horton QDI:[id number] DOB: 1965/05/12 DOA: 06/30/2021  PCP: Pcp, No  Patient coming from: Home  Chief Complaint: Change in behavior  HPI: Debbie Horton is a 56 y.o. female with no known past medical history per chart review presenting with acting unusual.  Patient presenting for ongoing somewhat unusual behavior.  For the last 1 to 2 weeks she states he has been feeling a little bit "off ".  Noticed that she was stumbling at times.  Denies any focal weakness or difficulty speaking.  Coworkers also noticed that she was often recommended she be seen in the emergency department.  In addition, she reports intermittent headache, not always typing the letter she means a type I texting or typing, some difficulty with reading, some word finding difficulty, some episodic nausea usually associated with her headaches, some visual spots and lites, some forgetfulness as well.  She denies fevers, chills, chest pain, shortness of breath, abdominal pain, constipation, diarrhea, nausea, vomiting.  ED Course: Vital signs in the ED stable.  Lab work-up included BMP with glucose 145.  CBC within normal limits.  CT head also performed showing large left vasogenic edema with mass effect causing 3 mm of left-to-right shift.  Neurosurgery consulted in the ED recommended Decadron for vasogenic edema, MRI with and without contrast.  Review of Systems: As per HPI otherwise all other systems reviewed and are negative.  History reviewed. No pertinent past medical history.  History reviewed. No pertinent surgical history.  Social History  reports that she has never smoked. She has never used smokeless tobacco. She reports current alcohol use. She reports that she does not use drugs.  Allergies  Allergen Reactions   Penicillins Rash   Phenergan [Promethazine] Palpitations   Sulfa Antibiotics Rash    History reviewed. No pertinent family history.  Prior to Admission medications    Not on File    Physical Exam: Vitals:   06/30/21 1927 06/30/21 1927 06/30/21 2045 06/30/21 2340  BP:  (!) 148/90 124/78 119/70  Pulse:  98 (!) 104 81  Resp:  16 18 18   Temp:  98.9 F (37.2 C)  98.4 F (36.9 C)  TempSrc:    Oral  SpO2:  94% 100% 100%  Weight: 59 kg     Height: 5' 3"  (1.6 m)       Physical Exam Constitutional:      General: She is not in acute distress.    Appearance: Normal appearance.  HENT:     Head: Normocephalic and atraumatic.     Mouth/Throat:     Mouth: Mucous membranes are moist.     Pharynx: Oropharynx is clear.  Eyes:     Extraocular Movements: Extraocular movements intact.     Pupils: Pupils are equal, round, and reactive to light.  Cardiovascular:     Rate and Rhythm: Normal rate and regular rhythm.     Pulses: Normal pulses.     Heart sounds: Normal heart sounds.  Pulmonary:     Effort: Pulmonary effort is normal. No respiratory distress.     Breath sounds: Normal breath sounds.  Abdominal:     General: Bowel sounds are normal. There is no distension.     Palpations: Abdomen is soft.     Tenderness: There is no abdominal tenderness.  Musculoskeletal:        General: No swelling or deformity.  Skin:    General: Skin is warm and dry.  Neurological:     General: No focal  deficit present.     Comments: Mental Status: Patient is awake, alert, oriented x3 No signs of aphasia or neglect Cranial Nerves: II: Pupils equal, round, and reactive to light.   III,IV, VI: With effortful right for gaze gaze reverts back to midline. V: Facial sensation is symmetric to light touch and  Temperature. VII: Facial movement is symmetric.  VIII: hearing is intact to voice X: Uvula elevates symmetrically XI: Shoulder shrug is symmetric. XII: tongue is midline without atrophy or fasciculations.  Motor: Good effort thorughout, at Least 5/5 bilateral UE, 5/5 bilateral lower extremitiy  Sensory: Sensation is grossly intact bilateral UEs & LEs   Labs on  Admission: I have personally reviewed following labs and imaging studies  CBC: Recent Labs  Lab 06/30/21 1932  WBC 8.1  NEUTROABS 5.8  HGB 13.0  HCT 39.2  MCV 91.0  PLT 010    Basic Metabolic Panel: Recent Labs  Lab 06/30/21 1932  NA 139  K 3.6  CL 103  CO2 25  GLUCOSE 145*  BUN 17  CREATININE 0.83  CALCIUM 9.6    GFR: Estimated Creatinine Clearance: 64.8 mL/min (by C-G formula based on SCr of 0.83 mg/dL).  Liver Function Tests: No results for input(s): AST, ALT, ALKPHOS, BILITOT, PROT, ALBUMIN in the last 168 hours.  Urine analysis: No results found for: COLORURINE, APPEARANCEUR, [place], [place], GLUCOSEU, HGBUR, BILIRUBINUR, KETONESUR, PROTEINUR, UROBILINOGEN, NITRITE, LEUKOCYTESUR  Radiological Exams on Admission: CT Head Wo Contrast  Result Date: 06/30/2021 CLINICAL DATA:  Neurologic deficit. EXAM: CT HEAD WITHOUT CONTRAST TECHNIQUE: Contiguous axial images were obtained from the base of the skull through the vertex without intravenous contrast. RADIATION DOSE REDUCTION: This exam was performed according to the departmental dose-optimization program which includes automated exposure control, adjustment of the mA and/or kV according to patient size and/or use of iterative reconstruction technique. COMPARISON:  None Available. FINDINGS: Brain: There is a large area of vasogenic edema involving the left posterior frontal, parietal, and occipital lobes. And ill-defined low attenuating rounded lesion in the medial left occipital lobe (21/2) suspicious for a mass. Further characterization with MRI without and with contrast is recommended. There is associated mild mass effect and compression of the left lateral ventricle and adjacent sulci. There is approximately 3 mm left right midline shift. There is no acute intracranial hemorrhage. Vascular: No hyperdense vessel or unexpected calcification. Skull: Normal. Negative for fracture or focal lesion. Sinuses/Orbits: No acute  finding. Other: None IMPRESSION: 1. No acute intracranial hemorrhage. 2. Large left vasogenic edema with associated mass effect and 3 mm left-to-right midline shift and findings concerning for underlying neoplasm. Further characterization with MRI without and with contrast is recommended. These results were called by telephone at the time of interpretation on 06/30/2021 at 8:27 pm to provider [facility] , who verbally acknowledged these results. Electronically Signed   By: Anner Crete M.D.   On: 06/30/2021 20:28    EKG: Independently reviewed.  Normal sinus rhythm at 97 bpm.  Nonspecific ST flattening.  No prior to compare  Assessment/Plan Principal Problem:   Brain mass   Brain mass > Patient presenting after feeling off for the past 1 to 2 weeks at the recommendation of her coworkers.  She does report noticing she was stumbling with no focal deficits that she can report. > CT scan in the ED showing large left-sided vasogenic edema with 3 mm left to right shift. > Neurosurgery consulted in the ED recommended MRI with and without contrast.  They also recommend  metastatic work-up if brain lesion appears to be met.  Did not recommend metastatic work-up if this is clearly GBM. > Received 10 mg Decadron in the ED with recommendation to continue this at 4 mg every 6 hours for vasogenic edema - Monitor on telemetry - Appreciate neurosurgery recommendations - MRI with and without contrast - Decadron 4 mg every 6 hours   DVT prophylaxis: SCDs for now Code Status:   Full Family Communication:  Updated at bedsided  Disposition Plan:   Patient is from:  Home  Anticipated DC to:  Home  Anticipated DC date:  1 to 3 days  Anticipated DC barriers: None  Consults called:  Neurosurgery consulted in the emergency department  admission status:  Observation, progressive  Severity of Illness: The appropriate patient status for this patient is OBSERVATION. Observation status is judged to be reasonable  and necessary in order to provide the required intensity of service to ensure the patient's safety. The patient's presenting symptoms, physical exam findings, and initial radiographic and laboratory data in the context of their medical condition is felt to place them at decreased risk for further clinical deterioration. Furthermore, it is anticipated that the patient will be medically stable for discharge from the hospital within 2 midnights of admission.    Marcelyn Bruins MD [facility]  How to contact the [facility] Attending or Consulting provider [place] or covering provider during after hours [place], for this patient?   Check the care team in [facility] and look for a) attending/consulting [facility] provider listed and b) the [facility] team listed Log into [email or web address] and use [place] universal password to access. If you do not have the password, please contact the hospital operator. Locate the [facility] provider you are looking for under [facility] and page to a number that you can be directly reached. If you still have difficulty reaching the provider, please page the [facility] (Director on Call) for the Hospitalists listed on amion for assistance.  07/01/2021, 12:32 AM

## 2021-07-01 NOTE — Consult Note (Signed)
   Providing Compassionate, Quality Care - Together  Neurosurgery Consult  Referring physician: Dr. Sloan Leiter Reason for referral: Brain tumor  Chief Complaint: Headaches, not feeling herself  History of Present Illness: This is a pleasant 56 year old, right-handed, female that states she started having daily headaches approximately 2 and half weeks ago.  She also states that her coworkers noted that she began having difficulty texting and typing and not quite acting herself and therefore was sent to the emergency department.  She also states that recently while driving she had some difficulty with the right side of her vision.  She denies any numbness, tingling or weakness at this time.  She has had intermittent bifrontal headaches.  She denies any seizure-like activity.  She does not have any significant past medical history whatsoever.  She is a non-smoker, social drinker.  Medications: I have reviewed the patient's current medications. Allergies: No Known Allergies  History reviewed. No pertinent family history. Social History:  has no history on file for tobacco use, alcohol use, and drug use.  ROS: All pertinent positives and negatives are listed in HPI above  Physical Exam:  Vital signs in last 24 hours: Temp:  [98 F (36.7 C)-98.3 F (36.8 C)] 98 F (36.7 C) (07/25 1814) Pulse Rate:  [58-128] 65 (07/26 0746) Resp:  [11-18] 14 (07/26 0217) BP: (138-182)/(65-125) 153/88 (07/26 0700) SpO2:  [91 %-98 %] 96 % (07/26 0746) PE: Awake alert oriented x3, no acute distress Speech fluent and appropriate PERRLA Cranial nerves II through XII intact  Visual field cut noted hemianopsia on the right EOMI Full strength upper and lower extremities Sensory intact to light touch throughout   Impression/Assessment:  56 year old female with  Left occipital tumor, 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  I will also obtain a MRI  stereo protocol for surgical planning.   -I had an extensive discussion with the patient and her daughter and her husband (who is on the phone) about the findings of the MRI and are concerned that this is likely a glioma, and likely an aggressive subtype.  We discussed biopsy versus surgical resection, which I recommended surgical resection given its location and size.  We discussed all risks, benefits and expected outcomes of surgical intervention. -Unfortunately there is nonoperative time on Friday therefore we will tentatively plan for surgery on Tuesday morning -Continue Decadron 4 mg every 6 hours -Okay for subcu heparin DVT prophylaxis  Thank you for allowing me to participate in this patient's care.  Please do not hesitate to call with questions or concerns.   Elwin Sleight, [facility] Cell: [phone]

## 2021-07-01 NOTE — Progress Notes (Signed)
  Transition of Care [facility]) Screening Note   Patient Details  Name: Debbie Horton Date of Birth: February 25, 1965   Transition of [facility]) CM/SW Contact:    Pollie Friar, RN Phone Number: 07/01/2021, 3:47 PM    Transition of Care Department [facility]) has reviewed patient. We will continue to monitor patient advancement through interdisciplinary progression rounds. If new patient transition needs arise, please place a TOC consult.

## 2021-07-02 ENCOUNTER — Other Ambulatory Visit: Payer: Self-pay | Admitting: Neurological Surgery

## 2021-07-02 ENCOUNTER — Inpatient Hospital Stay (HOSPITAL_COMMUNITY): Payer: 59

## 2021-07-02 DIAGNOSIS — G9389 Other specified disorders of brain: Secondary | ICD-10-CM | POA: Diagnosis not present

## 2021-07-02 LAB — BASIC METABOLIC PANEL
Anion gap: 10 (ref 5–15)
BUN: 16 mg/dL (ref 6–20)
CO2: 24 mmol/L (ref 22–32)
Calcium: 9.2 mg/dL (ref 8.9–10.3)
Chloride: 105 mmol/L (ref 98–111)
Creatinine, Ser: 0.82 mg/dL (ref 0.44–1.00)
GFR, Estimated: >60 mL/min (ref >60)
Glucose, Bld: 159 mg/dL — ABNORMAL HIGH (ref 70–99)
Potassium: 3.7 mmol/L (ref 3.5–5.1)
Sodium: 139 mmol/L (ref 135–145)

## 2021-07-02 IMAGING — CT CT CHEST-ABD-PELV W/ CM
2 of 5 series · 13 of 36 positions shown, 15 images · IV contrast (agent unspecified)
Comparison: None Available.

CLINICAL DATA: Brain metastases, unknown primary. * Tracking Code:
BO *

EXAM:
CT CHEST, ABDOMEN, AND PELVIS WITH CONTRAST
TECHNIQUE: Multidetector CT imaging of the chest, abdomen and pelvis was
performed following the standard protocol during bolus
administration of intravenous contrast.

[Series 3: cap with 5mm st · axial · 0.66mm/px · z∈[+730,+1270]mm · 10 of 128 slices shown, 12 images]
[im 10/128  mediastinal]
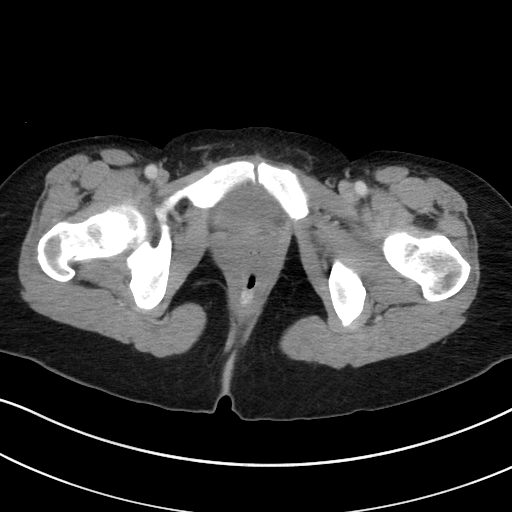
[im 10/128  bone]
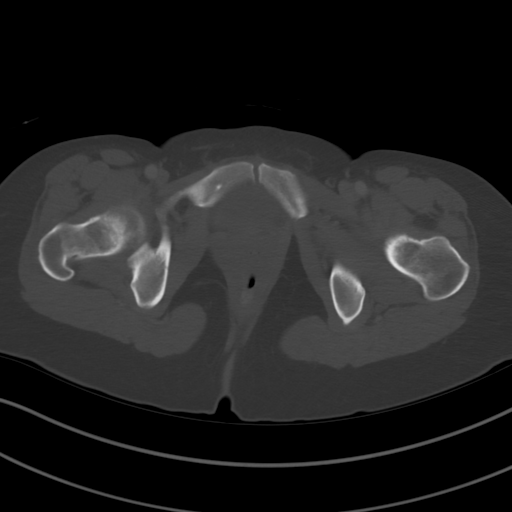
[im 20/128  mediastinal]
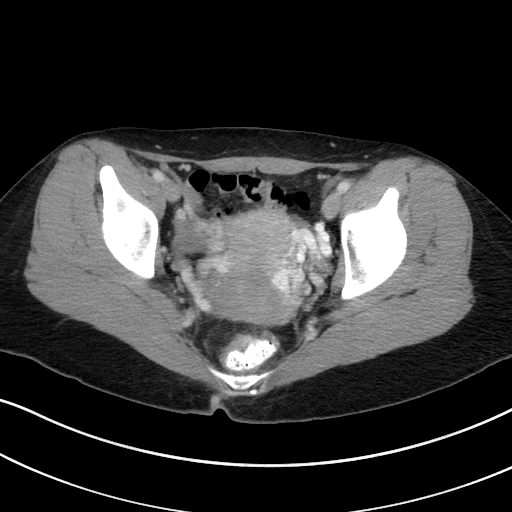
[im 40/128  mediastinal]
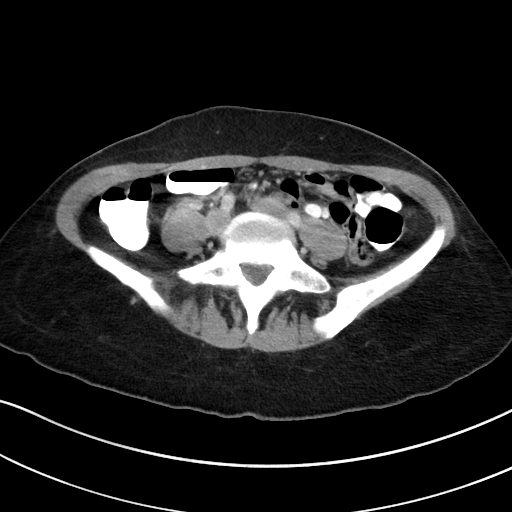
[im 49/128  mediastinal]
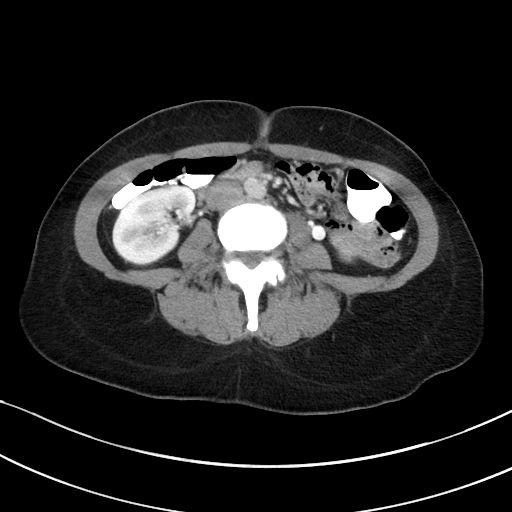
[im 59/128  mediastinal]
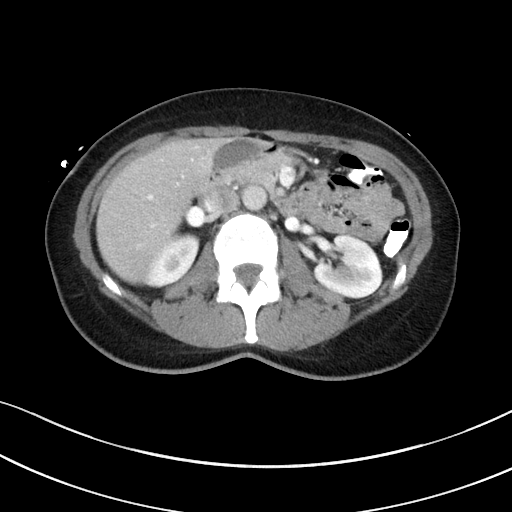
[im 69/128  mediastinal]
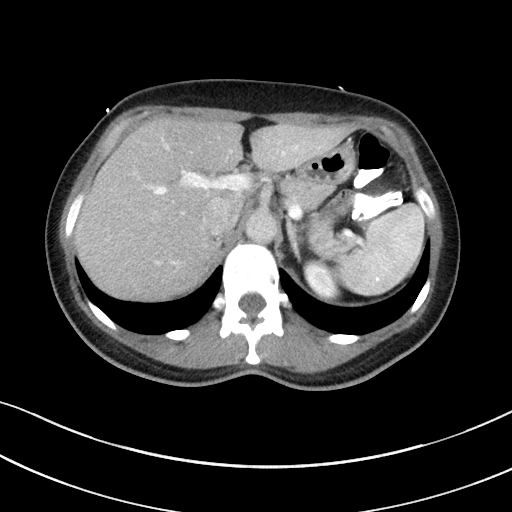
[im 79/128  mediastinal]
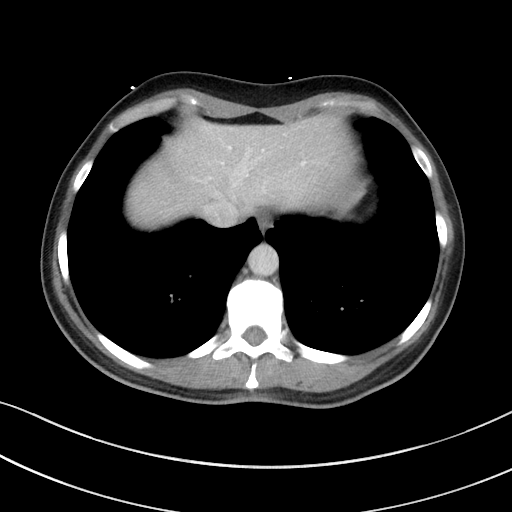
[im 98/128  mediastinal]
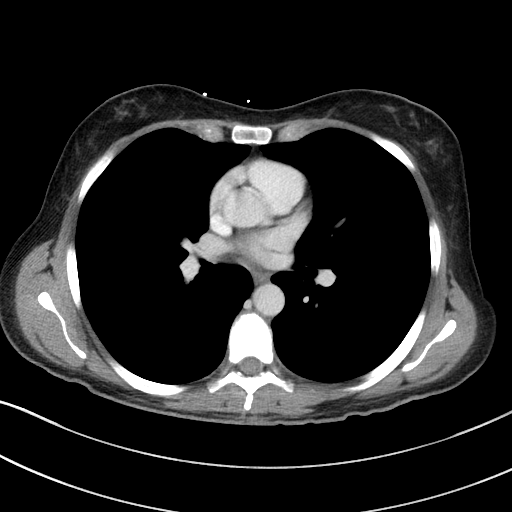
[im 108/128  mediastinal]
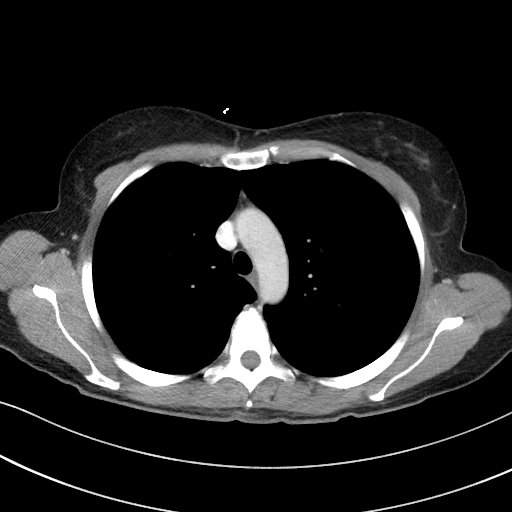
[im 108/128  bone]
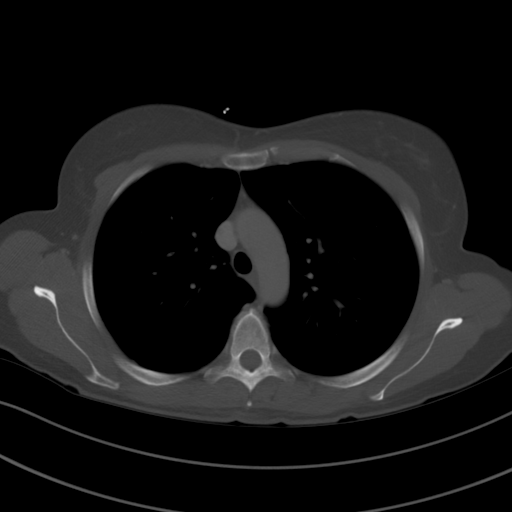
[im 118/128  mediastinal]
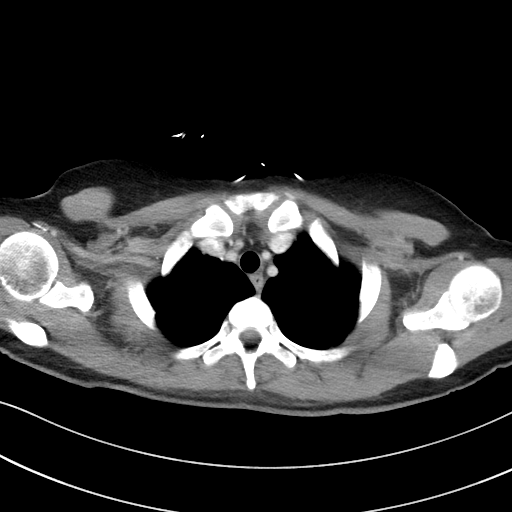

[Series 5: cap with 3mm st cor · coronal · 0.65mm/px · 3 of 150 slices shown]
[im 30/150  mediastinal]
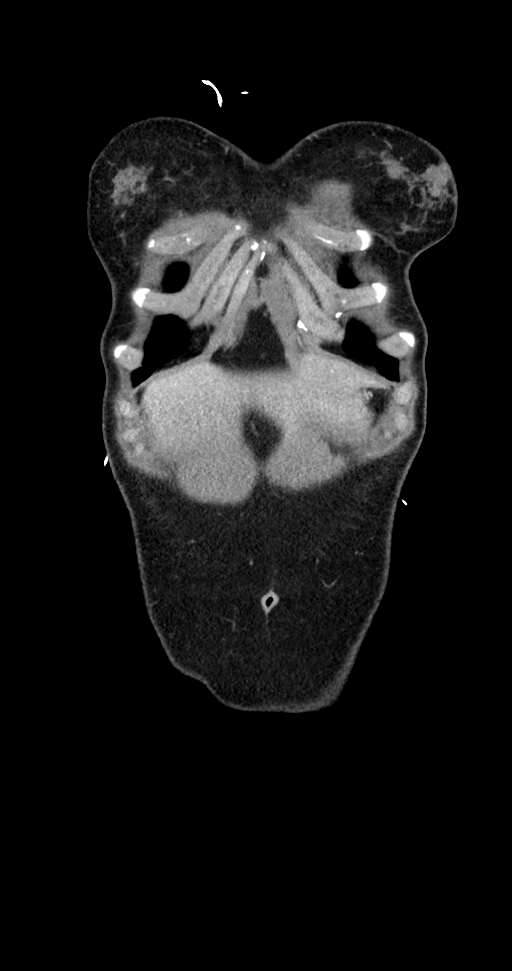
[im 60/150  mediastinal]
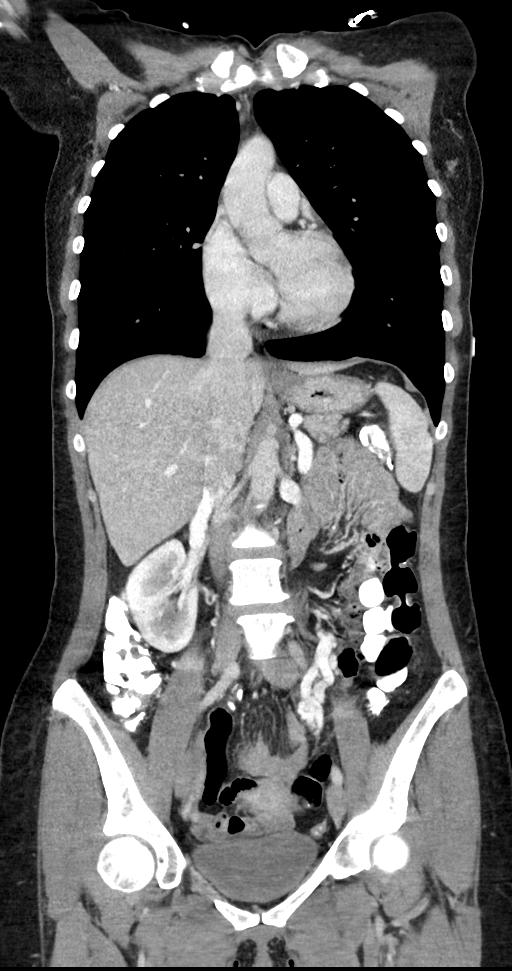
[im 90/150  mediastinal]
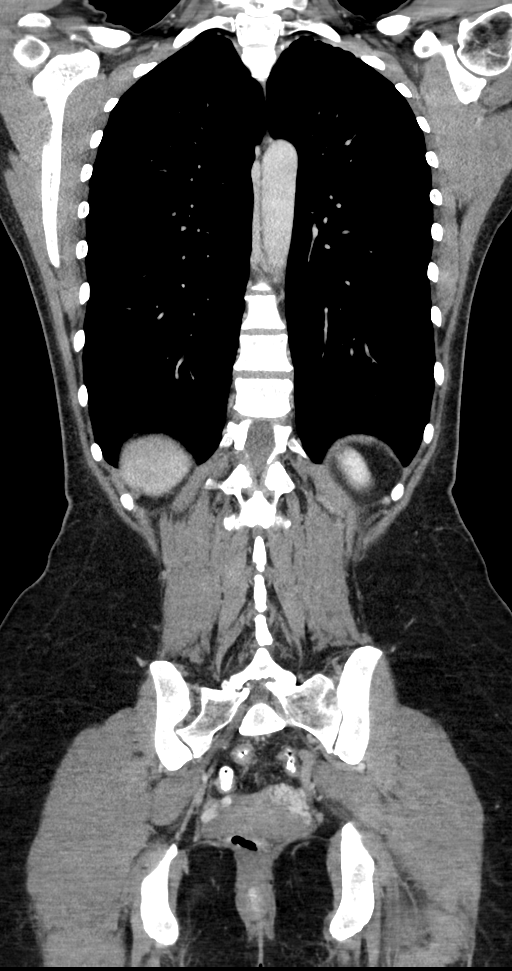

[13 of 36 positions shown; findings below may reference images not displayed]

RADIATION DOSE REDUCTION: This exam was performed according to the
departmental dose-optimization program which includes automated
exposure control, adjustment of the mA and/or kV according to
patient size and/or use of iterative reconstruction technique.

CONTRAST:  100mL OMNIPAQUE IOHEXOL 300 MG/ML  SOLN
FINDINGS: CT CHEST FINDINGS

Cardiovascular: Normal caliber thoracic aorta. No central pulmonary
embolus on this nondedicated study. Normal size heart. No
significant pericardial effusion/thickening.

Mediastinum/Nodes: Hypodense 6 mm nodule in the right lobe of the
thyroid. Not clinically significant; no follow-up imaging
recommended (Ref: [HOSPITAL]. [DATE]): 143-50).No
pathologically enlarged mediastinal, hilar or axillary lymph nodes.
Esophagus is grossly unremarkable.

Lungs/Pleura: Mild biapical pleuroparenchymal scarring. No
suspicious pulmonary nodules or masses. No focal airspace
consolidation. No pleural effusion. No pneumothorax.

Musculoskeletal: No aggressive lytic or blastic lesion of bone. No
suspicious chest wall mass.

CT ABDOMEN PELVIS FINDINGS

Hepatobiliary: Benign hypodense 8 mm cyst on image 55/3. Suspicious
hepatic lesion. Gallbladder is unremarkable. No biliary ductal
dilation.

Pancreas: No pancreatic ductal dilation or evidence of acute
inflammation.

Spleen: No splenomegaly or focal splenic lesion.

Adrenals/Urinary Tract: Bilateral adrenal glands appear normal. No
hydronephrosis. Kidneys demonstrate symmetric enhancement and
excretion of contrast material. No suspicious renal mass. Urinary
bladder is unremarkable for degree of distension.

Stomach/Bowel: Radiopaque enteric contrast material traverses the
rectum. Stomach is unremarkable for degree of distension. No
pathologic dilation of small or large bowel. The appendix and
terminal ileum appear normal. No evidence of acute bowel
inflammation.

Vascular/Lymphatic: Early filling of a dilated left gonadal vein
with dilated pelvic collateral vessels. Normal caliber abdominal
aorta. Smooth IVC contours. No pathologically enlarged abdominal or
pelvic lymph nodes.

Reproductive: Uterus and bilateral adnexa are unremarkable.

Other: No significant abdominopelvic free fluid.

Musculoskeletal: No acute or significant osseous findings.
IMPRESSION: 1. No evidence of primary malignancy or metastatic disease within
the chest, abdomen, or pelvis.
2. Early filling of a dilated left gonadal vein with dilated pelvic
collateral vessels, which can be seen with pelvic congestion
syndrome.

## 2021-07-02 IMAGING — MR MR HEAD WO/W CM
13 of 19 series · 26 of 48 positions shown · IV contrast (gadavist)
Comparison: Brain MRI without and with contrast [DATE].

CLINICAL DATA: 55-year-old female presenting with neurologic
deficit 2 days ago. Left hemisphere edema, enhancing 4 cm mass
centered in the left occipital lobe with MRI characteristics favor
in high-grade glioma. Preoperative surgical planning.

EXAM:
MRI HEAD WITHOUT AND WITH CONTRAST
TECHNIQUE: Multiplanar, multiecho pulse sequences of the brain and surrounding
structures were obtained without and with intravenous contrast.
CONTRAST:  6mL GADAVIST GADOBUTROL 1 MMOL/ML IV SOLN

[Series 5: DWI · axial · 3.0mm · 0.88mm/px · z∈[-114,+41]mm · 3 of 107 slices shown (1 of 4)]
[im 1/107]
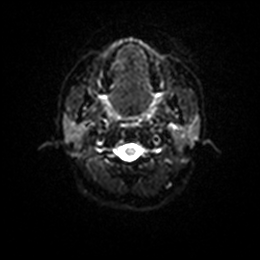
[im 54/107]
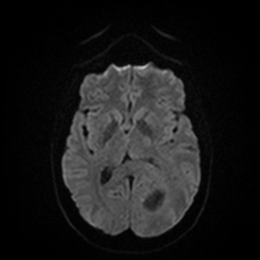
[im 107/107]
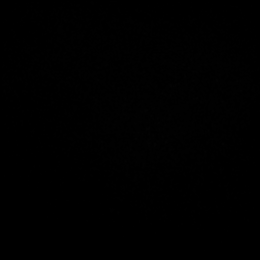

[Series 6: DWI · axial · 3.0mm · 0.88mm/px · 1 of 53 slices shown (2 of 4)]
[im 1/53]
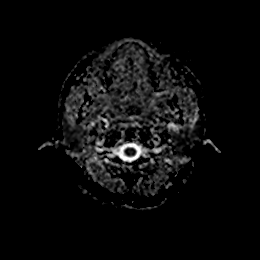

[Series 7: DWI · coronal · 4.0mm · 0.88mm/px · 3 of 84 slices shown (3 of 4)]
[im 1/84]
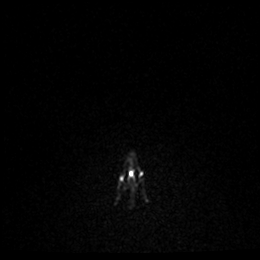
[im 42/84]
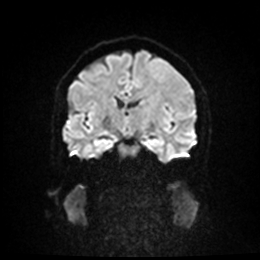
[im 84/84]
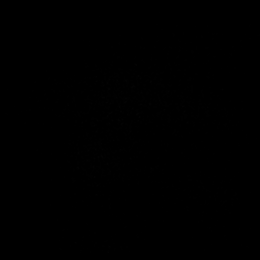

[Series 8: DWI · coronal · 4.0mm · 0.88mm/px · 1 of 41 slices shown (4 of 4)]
[im 1/41]
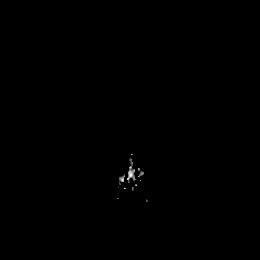

[Series 9: T1 · sagittal · 5.0mm · 0.75mm/px · 1 of 27 slices shown]
[im 1/27]
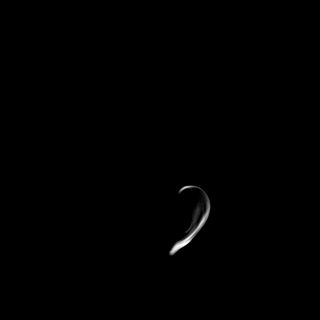

[Series 10: T2 · axial · 5.0mm · 0.72mm/px · 1 of 29 slices shown]
[im 1/29]
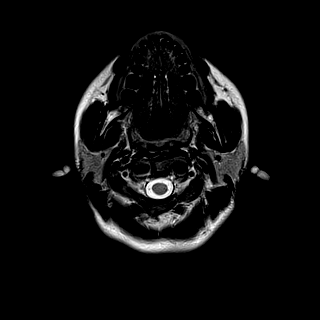

[Series 11: t2_space_dark-fluid_sag_p2_ns-ir · sagittal · 1.0mm · 0.49mm/px · 6 of 175 slices shown]
[im 1/175]
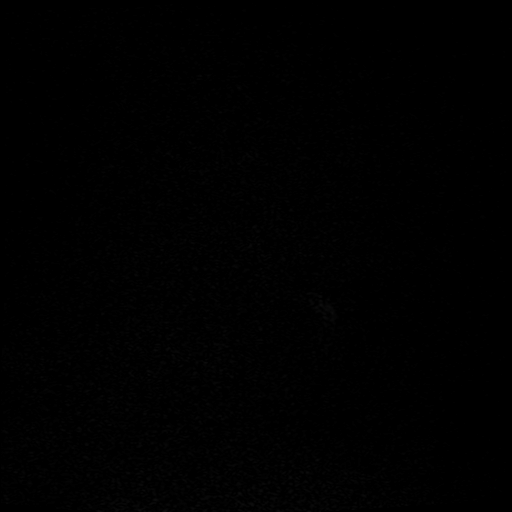
[im 35/175]
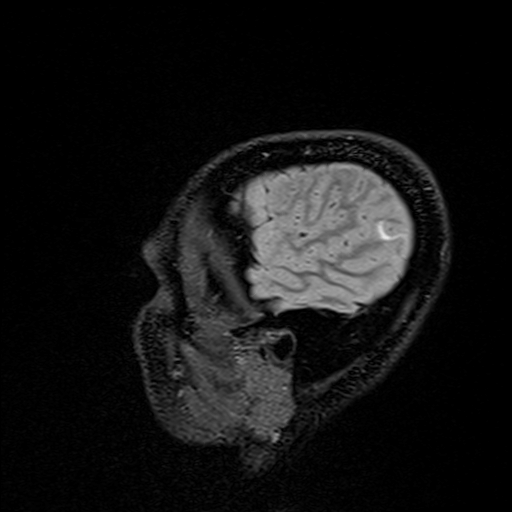
[im 70/175]
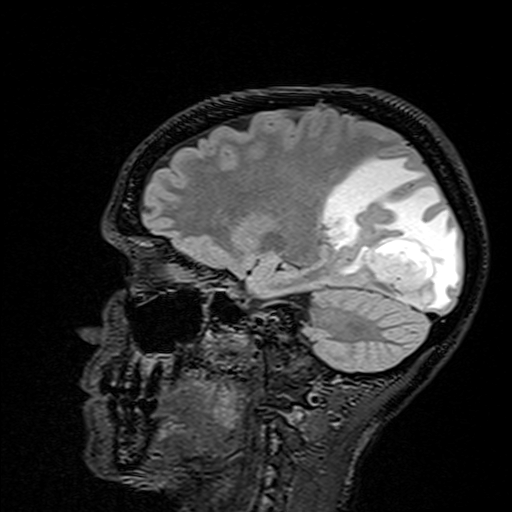
[im 105/175]
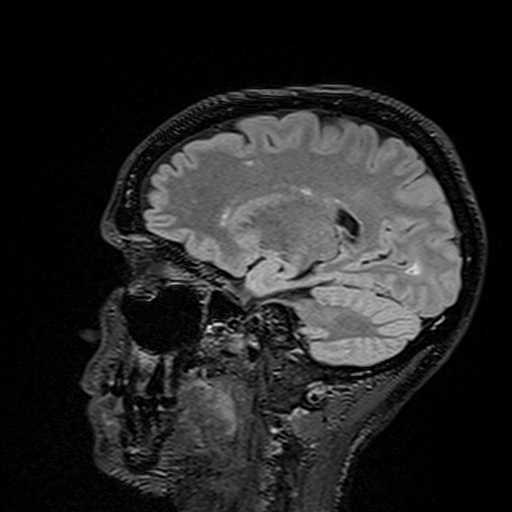
[im 140/175]
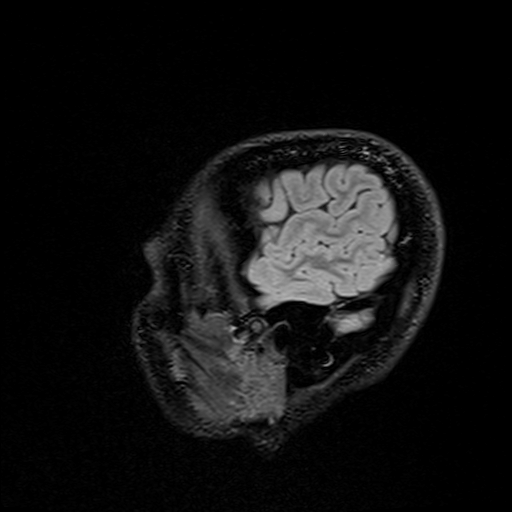
[im 175/175]
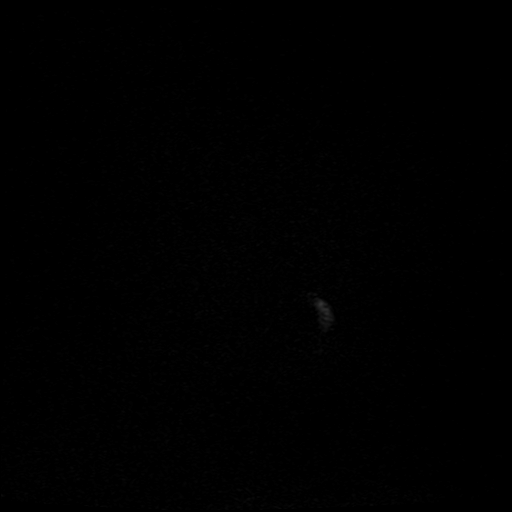

[Series 12: t2_space_dark-fluid_sag_p2_ns-ir_mpr_ axial · axial · 1.0mm · 0.45mm/px · z∈[-95,+21]mm · 4 of 120 slices shown]
[im 1/120]
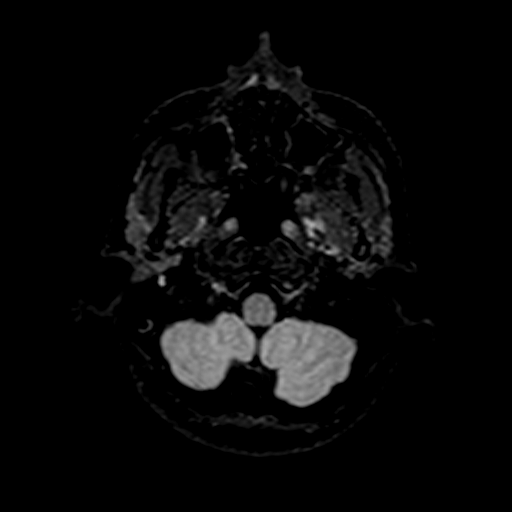
[im 40/120]
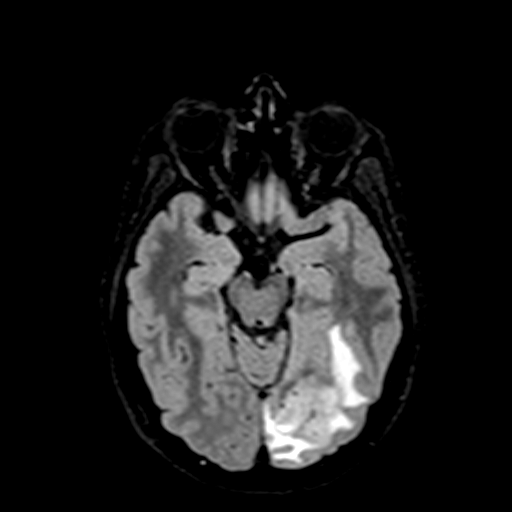
[im 80/120]
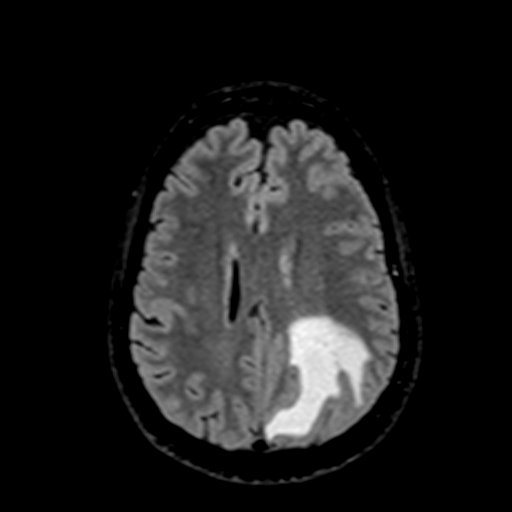
[im 120/120]
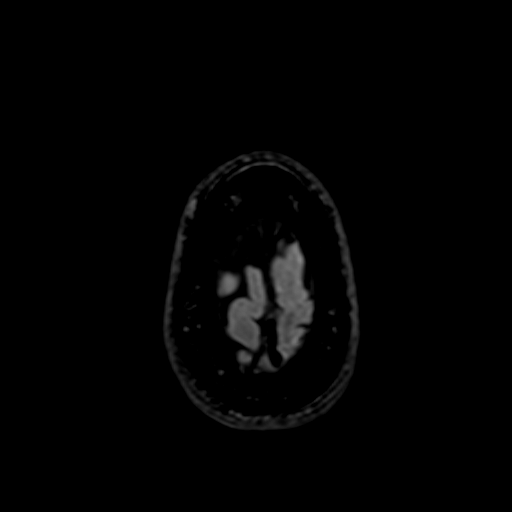

[Series 13: t2_space_dark-fluid_sag_p2_ns-ir_mpr_coronal · coronal · 1.0mm · 0.45mm/px · 2 of 120 slices shown]
[im 1/120]
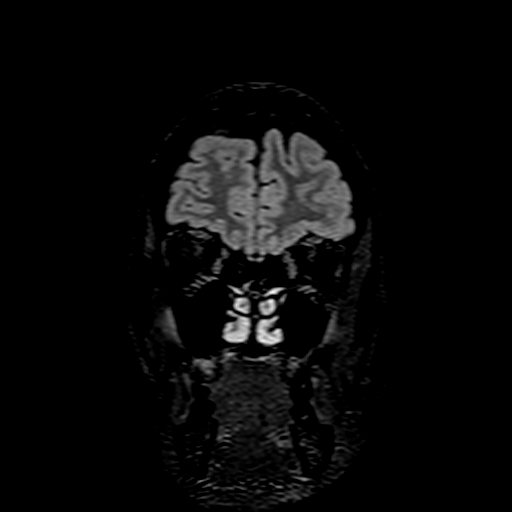
[im 40/120]
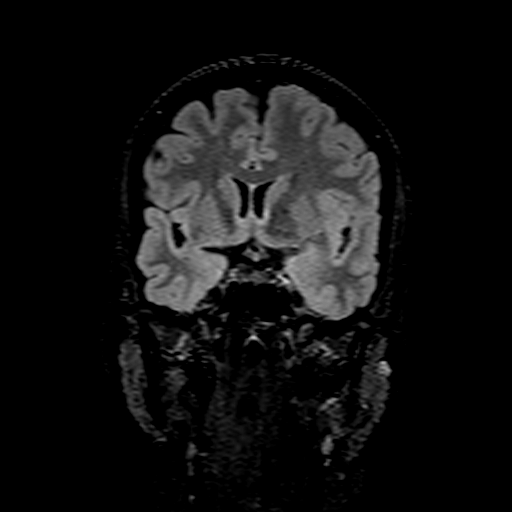

[Series 14: FLAIR · axial · 5.0mm · 0.45mm/px · 1 of 29 slices shown]
[im 1/29]
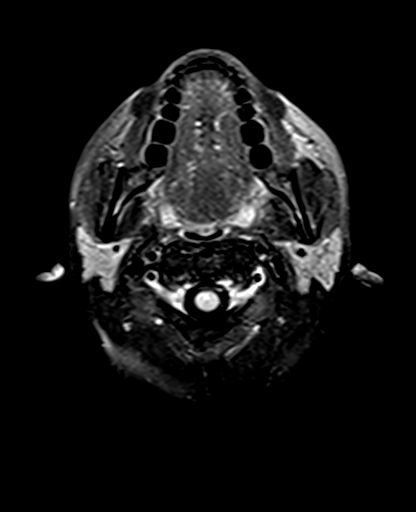

[Series 20: T2 post-contrast · coronal · 5.0mm · 0.72mm/px · 1 of 34 slices shown]
[im 1/34]
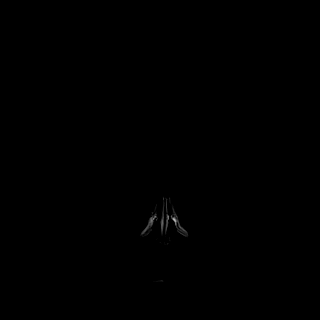

[Series 22: T1 post-contrast · coronal · 5.0mm · 0.34mm/px · 1 of 33 slices shown (1 of 2)]
[im 1/33]
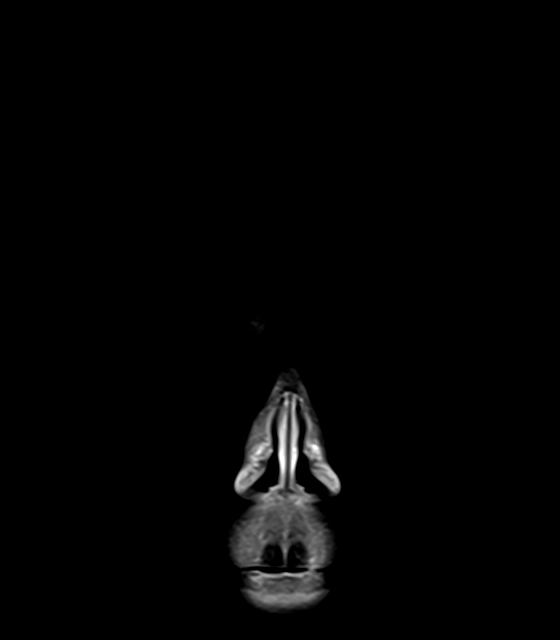

[Series 23: T1 post-contrast · sagittal · 5.0mm · 0.72mm/px · 1 of 27 slices shown (2 of 2)]
[im 1/27]
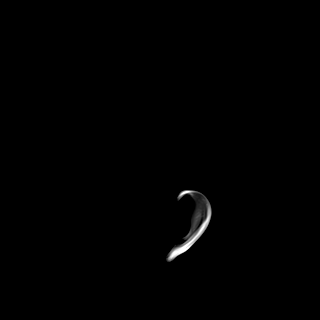

[26 of 48 positions shown; findings below may reference images not displayed]

FINDINGS: Brain: Unchanged heterogeneously enhancing, centrally cystic or
necrotic mass centered in the left occipital lobe with areas of
nonenhancing hypercellularity suggested on DWI. Heterogeneous
calcification or hemosiderin primarily along the caudal aspect of
the mass on SWI. Regional vasogenic versus tumoral edema. Enhancing
component encompasses 42 x 35 x 34 mm (AP by transverse by CC).

Stable mass effect on the left occipital horn and atrium of the left
lateral ventricle. Stable rightward midline shift of 5 mm.

No second lesion. No dural thickening. Basilar cisterns remain
patent. No superimposed restricted diffusion suggestive of acute
infarction. No ventriculomegaly, extra-axial collection or acute
intracranial hemorrhage. Cervicomedullary junction and pituitary are
within normal limits.

Vascular: Major intracranial vascular flow voids are stable. The
major dural venous sinuses are enhancing and appear to be patent.

Skull and upper cervical spine: Negative visible cervical spine and
spinal cord. Visualized bone marrow signal is within normal limits.

Sinuses/Orbits: Stable, negative.

Other: Visible internal auditory structures appear normal.
IMPRESSION: 1. Study for stereotactic surgical planning.

2. Unchanged left occipital lobe partially cystic/necrotic mass with
heterogeneous hyperenhancement and hypercellularity most compatible
with High-grade Glioma.
Stable tumoral edema and intracranial mass effect with 5 mm of
rightward midline shift.

3. No new intracranial abnormality.

## 2021-07-02 MED ORDER — IOHEXOL 9 MG/ML PO SOLN
ORAL | Status: AC
  Administered 2021-07-02: 500 mL
  Filled 2021-07-02: qty 1000

## 2021-07-02 MED ORDER — GADOBUTROL 1 MMOL/ML IV SOLN
6.0000 mL | Freq: Once | INTRAVENOUS | Status: AC | PRN
  Administered 2021-07-02: 6 mL via INTRAVENOUS

## 2021-07-02 MED ORDER — IOHEXOL 300 MG/ML  SOLN
100.0000 mL | Freq: Once | INTRAMUSCULAR | Status: AC | PRN
  Administered 2021-07-02: 100 mL via INTRAVENOUS

## 2021-07-02 NOTE — Progress Notes (Signed)
PROGRESS NOTE        PATIENT DETAILS Name: Debbie Horton Age: 56 y.o. Sex: female Date of Birth: Jan 11, 1966 Admit Date: 06/30/2021 Admitting Physician Evalee Mutton Kristeen Mans, MD PCP:Pcp, No  Brief Summary: Patient is a 56 y.o.  female with no past medical history-presented with 2-3-week history of headaches and some intermittent nausea.  Neuroimaging positive for left brain mass-suspicious for GBM.   Significant events: 5/17>> admit to [facility]-neuroimaging with left posterior cerebral mass with edema.  Significant studies: 5/17>> CT head: Large area of vasogenic edema involving left posterior frontal/parietal and occipital lobe-ill-defined rounded lesion in the medial left occipital lobe. 5/18>> MRI brain: Mass within posterior left hemisphere with large amount of surrounding edema-3 mm rightward midline shift.  Significant microbiology data:   Procedures:   Consults: Neurosurgery  Subjective: Headache better-some nausea yesterday but none today.  Objective: Vitals: Blood pressure 98/60, pulse 75, temperature 98.4 F (36.9 C), temperature source Oral, resp. rate 18, height '5\' 3"'$  (1.6 m), weight 59 kg, SpO2 99 %.   Exam: Gen Exam:Alert awake-not in any distress HEENT:atraumatic, normocephalic Chest: B/L clear to auscultation anteriorly CVS:S1S2 regular Abdomen:soft non tender, non distended Extremities:no edema Neurology: Non focal Skin: no rash   Pertinent Labs/Radiology:    Latest Ref Rng & Units 07/01/2021    3:28 AM 06/30/2021    7:32 PM  CBC  WBC 4.0 - 10.5 K/uL 8.2   8.1    Hemoglobin 12.0 - 15.0 g/dL 13.3   13.0    Hematocrit 36.0 - 46.0 % 39.8   39.2    Platelets 150 - 400 K/uL 194   193      Lab Results  Component Value Date   NA 139 07/02/2021   K 3.7 07/02/2021   CL 105 07/02/2021   CO2 24 07/02/2021       Assessment/Plan: Left posterior cerebral hemisphere mass with surrounding edema and rightward midline shift 3  mm: Suspicious for high-grade glioma-on IV steroids-headaches better but still has some intermittent nausea-neurosurgery planning surgery next week-awaiting CT chest/abdomen/pelvis to rule out a primary neoplasm (in case patient has a primary neoplasm elsewhere and has brain mets and not glioma)  Hypokalemia: Replete and recheck.  BMI: Estimated body mass index is 23.03 kg/m as calculated from the following:   Height as of this encounter: '5\' 3"'$  (1.6 m).   Weight as of this encounter: 59 kg.   Code status:   Code Status: Full Code   DVT Prophylaxis: SCDs Start: 07/01/21 0031   Family Communication:Spouse at bedside on 5/18   Disposition Plan: Status is: Observation The patient will require care spanning > 2 midnights and should be moved to inpatient because: Brain mass with edema/3 mm midline shift-on IV steroids-awaiting recommendations from neurosurgery.   Planned Discharge Destination:Home hopefully in the next 1-2 days.  Pending on neurosurgical opinion.   Diet: Diet Order             Diet regular Room service appropriate? Yes; Fluid consistency: Thin  Diet effective now                     Antimicrobial agents: Anti-infectives (From admission, onward)    None        MEDICATIONS: Scheduled Meds:  dexamethasone (DECADRON) injection  4 mg Intravenous Q6H   potassium chloride  40 mEq Oral  Daily   sodium chloride flush  3 mL Intravenous Q12H   Continuous Infusions: PRN Meds:.acetaminophen **OR** acetaminophen   I have personally reviewed following labs and imaging studies  LABORATORY DATA: CBC: Recent Labs  Lab 06/30/21 1932 07/01/21 0328  WBC 8.1 8.2  NEUTROABS 5.8  --   HGB 13.0 13.3  HCT 39.2 39.8  MCV 91.0 91.3  PLT 193 194     Basic Metabolic Panel: Recent Labs  Lab 06/30/21 1932 07/01/21 0328 07/02/21 0334  NA 139 139 139  K 3.6 3.4* 3.7  CL 103 108 105  CO2 '25 24 24  '$ GLUCOSE 145* 151* 159*  BUN '17 11 16  '$ CREATININE 0.83  0.76 0.82  CALCIUM 9.6 9.2 9.2     GFR: Estimated Creatinine Clearance: 65.6 mL/min (by C-G formula based on SCr of 0.82 mg/dL).  Liver Function Tests: No results for input(s): AST, ALT, ALKPHOS, BILITOT, PROT, ALBUMIN in the last 168 hours. No results for input(s): LIPASE, AMYLASE in the last 168 hours. No results for input(s): AMMONIA in the last 168 hours.  Coagulation Profile: No results for input(s): INR, PROTIME in the last 168 hours.  Cardiac Enzymes: No results for input(s): CKTOTAL, CKMB, CKMBINDEX, TROPONINI in the last 168 hours.  BNP (last 3 results) No results for input(s): PROBNP in the last 8760 hours.  Lipid Profile: No results for input(s): CHOL, HDL, LDLCALC, TRIG, CHOLHDL, LDLDIRECT in the last 72 hours.  Thyroid Function Tests: No results for input(s): TSH, T4TOTAL, FREET4, T3FREE, THYROIDAB in the last 72 hours.  Anemia Panel: No results for input(s): VITAMINB12, FOLATE, FERRITIN, TIBC, IRON, RETICCTPCT in the last 72 hours.  Urine analysis: No results found for: COLORURINE, APPEARANCEUR, LABSPEC, PHURINE, GLUCOSEU, HGBUR, BILIRUBINUR, KETONESUR, PROTEINUR, UROBILINOGEN, NITRITE, LEUKOCYTESUR  Sepsis Labs: Lactic Acid, Venous No results found for: LATICACIDVEN  MICROBIOLOGY: No results found for this or any previous visit (from the past 240 hour(s)).  RADIOLOGY STUDIES/RESULTS: CT Head Wo Contrast  Result Date: 06/30/2021 CLINICAL DATA:  Neurologic deficit. EXAM: CT HEAD WITHOUT CONTRAST TECHNIQUE: Contiguous axial images were obtained from the base of the skull through the vertex without intravenous contrast. RADIATION DOSE REDUCTION: This exam was performed according to the departmental dose-optimization program which includes automated exposure control, adjustment of the mA and/or kV according to patient size and/or use of iterative reconstruction technique. COMPARISON:  None Available. FINDINGS: Brain: There is a large area of vasogenic edema  involving the left posterior frontal, parietal, and occipital lobes. And ill-defined low attenuating rounded lesion in the medial left occipital lobe (21/2) suspicious for a mass. Further characterization with MRI without and with contrast is recommended. There is associated mild mass effect and compression of the left lateral ventricle and adjacent sulci. There is approximately 3 mm left right midline shift. There is no acute intracranial hemorrhage. Vascular: No hyperdense vessel or unexpected calcification. Skull: Normal. Negative for fracture or focal lesion. Sinuses/Orbits: No acute finding. Other: None IMPRESSION: 1. No acute intracranial hemorrhage. 2. Large left vasogenic edema with associated mass effect and 3 mm left-to-right midline shift and findings concerning for underlying neoplasm. Further characterization with MRI without and with contrast is recommended. These results were called by telephone at the time of interpretation on 06/30/2021 at 8:27 pm to provider [facility] , who verbally acknowledged these results. Electronically Signed   By: Anner Crete M.D.   On: 06/30/2021 20:28   MR BRAIN W WO CONTRAST  Result Date: 07/02/2021 CLINICAL DATA:  56 year old female presenting  with neurologic deficit 2 days ago. Left hemisphere edema, enhancing 4 cm mass centered in the left occipital lobe with MRI characteristics favor in high-grade glioma. Preoperative surgical planning. EXAM: MRI HEAD WITHOUT AND WITH CONTRAST TECHNIQUE: Multiplanar, multiecho pulse sequences of the brain and surrounding structures were obtained without and with intravenous contrast. CONTRAST:  81m GADAVIST GADOBUTROL 1 MMOL/ML IV SOLN COMPARISON:  Brain MRI without and with contrast 07/01/2021. FINDINGS: Brain: Unchanged heterogeneously enhancing, centrally cystic or necrotic mass centered in the left occipital lobe with areas of nonenhancing hypercellularity suggested on DWI. Heterogeneous calcification or hemosiderin  primarily along the caudal aspect of the mass on SWI. Regional vasogenic versus tumoral edema. Enhancing component encompasses 42 x 35 x 34 mm (AP by transverse by CC). Stable mass effect on the left occipital horn and atrium of the left lateral ventricle. Stable rightward midline shift of 5 mm. No second lesion. No dural thickening. Basilar cisterns remain patent. No superimposed restricted diffusion suggestive of acute infarction. No ventriculomegaly, extra-axial collection or acute intracranial hemorrhage. Cervicomedullary junction and pituitary are within normal limits. Vascular: Major intracranial vascular flow voids are stable. The major dural venous sinuses are enhancing and appear to be patent. Skull and upper cervical spine: Negative visible cervical spine and spinal cord. Visualized bone marrow signal is within normal limits. Sinuses/Orbits: Stable, negative. Other: Visible internal auditory structures appear normal. IMPRESSION: 1. Study for stereotactic surgical planning. 2. Unchanged left occipital lobe partially cystic/necrotic mass with heterogeneous hyperenhancement and hypercellularity most compatible with High-grade Glioma. Stable tumoral edema and intracranial mass effect with 5 mm of rightward midline shift. 3. No new intracranial abnormality. Electronically Signed   By: HGenevie AnnM.D.   On: 07/02/2021 05:34   MR Brain W and Wo Contrast  Result Date: 07/01/2021 CLINICAL DATA:  Intracranial neoplasm EXAM: MRI HEAD WITHOUT AND WITH CONTRAST TECHNIQUE: Multiplanar, multiecho pulse sequences of the brain and surrounding structures were obtained without and with intravenous contrast. CONTRAST:  665mGADAVIST GADOBUTROL 1 MMOL/ML IV SOLN COMPARISON:  Head CT 06/30/2021 FINDINGS: Brain: Mass within the posterior left hemisphere measures 3.8 x 2.6 cm. Most of the central component is necrotic/cystic. There is mildly reduced diffusivity within the solid component. There is petechial hemorrhage within the  lesion. There is a large amount of surrounding edema with rightward midline shift of 3 mm. The abnormal T2-weighted signal extends to the splenium of the corpus callosum. There are no other contrast-enhancing lesions.  No hydrocephalus. Vascular: Normal flow voids. Skull and upper cervical spine: Normal marrow signal. Sinuses/Orbits: Negative. Other: None. IMPRESSION: Mass within the posterior left hemisphere with large amount of surrounding edema and 3 mm of rightward midline shift. 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 Electronically Signed   By: KeUlyses Jarred.D.   On: 07/01/2021 02:25     LOS: 1 day   ShOren BinetMD  [facility]    To contact the attending provider between 7A-7P or the covering provider during after hours 7P-7A, please log into the web site [email or web address] and access using universal [place] password for that web site. If you do not have the password, please call the hospital operator.  07/02/2021, 1:54 PM

## 2021-07-02 NOTE — Progress Notes (Signed)
   Providing Compassionate, Quality Care - Together  NEUROSURGERY PROGRESS NOTE   S: No issues overnight.   O: EXAM:  BP 98/60 (BP Location: Left Arm)   Pulse 75   Temp 98.4 F (36.9 C) (Oral)   Resp 18   Ht '5\' 3"'$  (1.6 m)   Wt 59 kg   SpO2 99%   BMI 23.03 kg/m   Awake, alert, oriented x3 PERRL Speech fluent, appropriate  CNs grossly intact  Right hemianopsia stable 5/5 BUE/BLE   ASSESSMENT:  56 y.o. female with    Left occipital tumor, concerning for glioma   PLAN: -OR planned for Tuesday morning at 730.  I showed the MRI images to the patient's husband as well as the patient again today.  I discussed with them the surgical plan, and answered all of their questions. -Continue Decadron 4 every 6 -CT chest abdomen pelvis pending for metastatic work-up -I will be taking over as attending at this time    Thank you for allowing me to participate in this patient's care.  Please do not hesitate to call with questions or concerns.   Elwin Sleight, [facility] Cell: [phone]

## 2021-07-02 NOTE — Progress Notes (Signed)
Patient reports feeling of squeezy when tech entered the room. RN went to checked the pt she is sitting on bed, awake and oriented x4 and said she is feeling better now. Denies headache, numbness or tingling sensation. Will continue to monitor.

## 2021-07-03 MED ORDER — ESTRADIOL 0.05 MG/24HR TD PTWK
0.0500 mg | MEDICATED_PATCH | TRANSDERMAL | Status: DC
  Administered 2021-07-07: 0.05 mg via TRANSDERMAL
  Filled 2021-07-03: qty 1

## 2021-07-03 MED ORDER — ZOLPIDEM TARTRATE 5 MG PO TABS
5.0000 mg | ORAL_TABLET | Freq: Every evening | ORAL | Status: DC | PRN
  Administered 2021-07-03 – 2021-07-07 (×4): 5 mg via ORAL
  Filled 2021-07-03 (×4): qty 1

## 2021-07-03 MED ORDER — LEVOCETIRIZINE DIHYDROCHLORIDE 5 MG PO TABS: 5.0000 mg | ORAL_TABLET | Freq: Every day | ORAL | Status: DC | PRN

## 2021-07-03 MED ORDER — LORATADINE 10 MG PO TABS: 10.0000 mg | ORAL_TABLET | Freq: Every day | ORAL | Status: DC | PRN

## 2021-07-03 MED ORDER — PROGESTERONE 200 MG PO CAPS
200.0000 mg | ORAL_CAPSULE | Freq: Every day | ORAL | Status: DC
Start: 2021-07-03 — End: 2021-07-08
  Administered 2021-07-03 – 2021-07-07 (×5): 200 mg via ORAL
  Filled 2021-07-03 (×6): qty 1

## 2021-07-03 NOTE — Progress Notes (Signed)
Patient ID: Debbie Horton, female   DOB: February 09, 1966, 56 y.o.   MRN: [id number] BP 107/78 (BP Location: Right Arm)   Pulse 76   Temp 98.6 F (37 C) (Oral)   Resp 20   Ht '5\' 3"'$  (1.6 m)   Wt 59 kg   SpO2 95%   BMI 23.03 kg/m  Alert and oriented x 4 Speech is clear and fluent Symmetric facies, tongue and uvula midline Moving all extremities well Right homonymous hemianopsia Awaiting surgery on Tuesday Ct is negative

## 2021-07-04 NOTE — Progress Notes (Signed)
Patient ID: Debbie Horton, female   DOB: Jan 09, 1966, 56 y.o.   MRN: [id number] BP (!) 105/59 (BP Location: Left Arm)   Pulse 70   Temp 98.5 F (36.9 C) (Oral)   Resp 20   Ht '5\' 3"'$  (1.6 m)   Wt 59 kg   SpO2 98%   BMI 23.03 kg/m  Alert and oriented x 4, speech is clear Right visual field cut Moving all extremities well Awaiting surgery

## 2021-07-05 NOTE — TOC Initial Note (Signed)
Transition of Care [facility]) - Initial/Assessment Note    Patient Details  Name: Debbie Horton MRN: [id number] Date of Birth: 12-03-1965  Transition of Care [facility]) CM/SW Contact:    Pollie Friar, RN Phone Number: 07/05/2021, 11:41 AM  Clinical Narrative:                 Patient is from home with her spouse and 54 year old daughter. She was driving and managing her own medications and affairs.  Family is able to provide needed transportation and supervision at home.  OR scheduled for tomorrow. TOC will follow for needs post surgery.    Expected Discharge Plan: Home/Self Care Barriers to Discharge: Continued Medical Work up   Patient Goals and CMS Choice        Expected Discharge Plan and Services Expected Discharge Plan: Home/Self Care       Living arrangements for the past 2 months: Single Family Home                                      Prior Living Arrangements/Services Living arrangements for the past 2 months: Single Family Home Lives with:: Spouse, Adult Children Patient language and need for interpreter reviewed:: Yes Do you feel safe going back to the place where you live?: Yes      Need for Family Participation in Patient Care: Yes (Comment) Care giver support system in place?: Yes (comment)   Criminal Activity/Legal Involvement Pertinent to Current Situation/Hospitalization: No - Comment as needed  Activities of Daily Living Home Assistive Devices/Equipment: None ADL Screening (condition at time of admission) Patient's cognitive ability adequate to safely complete daily activities?: Yes Is the patient deaf or have difficulty hearing?: No Does the patient have difficulty seeing, even when wearing glasses/contacts?: No Does the patient have difficulty concentrating, remembering, or making decisions?: Yes Patient able to express need for assistance with ADLs?: Yes Does the patient have difficulty dressing or bathing?: No Independently performs  ADLs?: Yes (appropriate for developmental age) Does the patient have difficulty walking or climbing stairs?: No Weakness of Legs: None Weakness of Arms/Hands: None  Permission Sought/Granted                  Emotional Assessment Appearance:: Appears stated age Attitude/Demeanor/Rapport: Engaged Affect (typically observed): Accepting Orientation: : Oriented to Self, Oriented to Place, Oriented to  Time, Oriented to Situation   Psych Involvement: No (comment)  Admission diagnosis:  Brain mass [G93.89] Patient Active Problem List   Diagnosis Date Noted   Brain mass 06/30/2021   PCP:  Pcp, No Pharmacy:   [id number] [place] [address] [place] [address]Phone: [phone]: [phone]    Social Determinants of Health (SDOH) Interventions    Readmission Risk Interventions     View : No data to display.

## 2021-07-06 ENCOUNTER — Inpatient Hospital Stay (HOSPITAL_COMMUNITY): Payer: 59

## 2021-07-06 ENCOUNTER — Inpatient Hospital Stay (HOSPITAL_COMMUNITY)
Admission: EM | Disposition: A | Discharge status: Home / Self Care | Payer: Self-pay | Source: Home / Self Care | Attending: Neurological Surgery

## 2021-07-06 DIAGNOSIS — D496 Neoplasm of unspecified behavior of brain: Secondary | ICD-10-CM

## 2021-07-06 HISTORY — PX: APPLICATION OF CRANIAL NAVIGATION: SHX6578

## 2021-07-06 HISTORY — PX: CRANIOTOMY: SHX93

## 2021-07-06 LAB — TYPE AND SCREEN
ABO/RH(D): O NEG
Antibody Screen: NEGATIVE

## 2021-07-06 LAB — SURGICAL PCR SCREEN
MRSA, PCR: NEGATIVE
Staphylococcus aureus: NEGATIVE

## 2021-07-06 LAB — CBC
HCT: 36.6 % (ref 36.0–46.0)
Hemoglobin: 12.5 g/dL (ref 12.0–15.0)
MCH: 30.9 pg (ref 26.0–34.0)
MCHC: 34.2 g/dL (ref 30.0–36.0)
MCV: 90.6 fL (ref 80.0–100.0)
Platelets: 208 10*3/uL (ref 150–400)
RBC: 4.04 MIL/uL (ref 3.87–5.11)
RDW: 11.5 % (ref 11.5–15.5)
WBC: 19.2 10*3/uL — ABNORMAL HIGH (ref 4.0–10.5)
nRBC: 0 % (ref 0.0–0.2)

## 2021-07-06 LAB — CREATININE, SERUM
Creatinine, Ser: 0.79 mg/dL (ref 0.44–1.00)
GFR, Estimated: >60 mL/min (ref >60)

## 2021-07-06 LAB — ABO/RH: ABO/RH(D): O NEG

## 2021-07-06 SURGERY — CRANIOTOMY TUMOR EXCISION
Anesthesia: General | Site: Head

## 2021-07-06 MED ORDER — ROCURONIUM BROMIDE 10 MG/ML (PF) SYRINGE
PREFILLED_SYRINGE | INTRAVENOUS | Status: AC
  Filled 2021-07-06: qty 10

## 2021-07-06 MED ORDER — FENTANYL CITRATE (PF) 250 MCG/5ML IJ SOLN
INTRAMUSCULAR | Status: DC | PRN
Start: 2021-07-06 — End: 2021-07-06
  Administered 2021-07-06 (×5): 50 ug via INTRAVENOUS

## 2021-07-06 MED ORDER — THROMBIN 5000 UNITS EX SOLR
CUTANEOUS | Status: AC
  Filled 2021-07-06: qty 5000

## 2021-07-06 MED ORDER — THROMBIN 20000 UNITS EX SOLR
CUTANEOUS | Status: AC
  Filled 2021-07-06: qty 20000

## 2021-07-06 MED ORDER — MORPHINE SULFATE (PF) 2 MG/ML IV SOLN: 1.0000 mg | INTRAVENOUS | Status: DC | PRN

## 2021-07-06 MED ORDER — LEVETIRACETAM IN NACL 1000 MG/100ML IV SOLN
1000.0000 mg | INTRAVENOUS | Status: AC
Start: 2021-07-06 — End: 2021-07-06
  Administered 2021-07-06: 1000 mg via INTRAVENOUS
  Filled 2021-07-06: qty 100

## 2021-07-06 MED ORDER — ONDANSETRON HCL 4 MG PO TABS: 4.0000 mg | ORAL_TABLET | ORAL | Status: DC | PRN

## 2021-07-06 MED ORDER — ONDANSETRON HCL 4 MG/2ML IJ SOLN: 4.0000 mg | Freq: Four times a day (QID) | INTRAMUSCULAR | Status: DC | PRN

## 2021-07-06 MED ORDER — BACITRACIN ZINC 500 UNIT/GM EX OINT
TOPICAL_OINTMENT | CUTANEOUS | Status: DC | PRN
Start: 2021-07-06 — End: 2021-07-06
  Administered 2021-07-06: 1 via TOPICAL

## 2021-07-06 MED ORDER — HYDROMORPHONE HCL 1 MG/ML IJ SOLN
INTRAMUSCULAR | Status: AC
  Filled 2021-07-06: qty 1

## 2021-07-06 MED ORDER — PANTOPRAZOLE SODIUM 40 MG IV SOLR
40.0000 mg | Freq: Every day | INTRAVENOUS | Status: DC
  Administered 2021-07-06: 40 mg via INTRAVENOUS
  Filled 2021-07-06: qty 10

## 2021-07-06 MED ORDER — BACITRACIN ZINC 500 UNIT/GM EX OINT
TOPICAL_OINTMENT | CUTANEOUS | Status: AC
  Filled 2021-07-06: qty 28.35

## 2021-07-06 MED ORDER — SODIUM CHLORIDE 0.9 % IV SOLN: INTRAVENOUS | Status: DC

## 2021-07-06 MED ORDER — DOCUSATE SODIUM 100 MG PO CAPS
100.0000 mg | ORAL_CAPSULE | Freq: Two times a day (BID) | ORAL | Status: DC
  Administered 2021-07-06 – 2021-07-08 (×4): 100 mg via ORAL
  Filled 2021-07-06 (×4): qty 1

## 2021-07-06 MED ORDER — CHLORHEXIDINE GLUCONATE CLOTH 2 % EX PADS: 6.0000 | MEDICATED_PAD | Freq: Once | CUTANEOUS | Status: DC

## 2021-07-06 MED ORDER — OXYCODONE HCL 5 MG/5ML PO SOLN: 5.0000 mg | Freq: Once | ORAL | Status: DC | PRN

## 2021-07-06 MED ORDER — PROPOFOL 10 MG/ML IV BOLUS
INTRAVENOUS | Status: DC | PRN
Start: 2021-07-06 — End: 2021-07-06
  Administered 2021-07-06: 50 mg via INTRAVENOUS
  Administered 2021-07-06: 200 mg via INTRAVENOUS
  Administered 2021-07-06: 50 mg via INTRAVENOUS

## 2021-07-06 MED ORDER — DEXAMETHASONE SODIUM PHOSPHATE 10 MG/ML IJ SOLN
INTRAMUSCULAR | Status: AC
  Filled 2021-07-06: qty 1

## 2021-07-06 MED ORDER — FENTANYL CITRATE (PF) 250 MCG/5ML IJ SOLN
INTRAMUSCULAR | Status: AC
  Filled 2021-07-06: qty 5

## 2021-07-06 MED ORDER — PROPOFOL 10 MG/ML IV BOLUS
INTRAVENOUS | Status: AC
  Filled 2021-07-06: qty 20

## 2021-07-06 MED ORDER — HEPARIN SODIUM (PORCINE) 5000 UNIT/ML IJ SOLN
5000.0000 [IU] | Freq: Two times a day (BID) | INTRAMUSCULAR | Status: DC
  Administered 2021-07-07 – 2021-07-08 (×2): 5000 [IU] via SUBCUTANEOUS
  Filled 2021-07-06 (×2): qty 1

## 2021-07-06 MED ORDER — GELATIN ABSORBABLE 100 EX MISC: CUTANEOUS | Status: DC | PRN

## 2021-07-06 MED ORDER — LIDOCAINE 2% (20 MG/ML) 5 ML SYRINGE
INTRAMUSCULAR | Status: AC
  Filled 2021-07-06: qty 5

## 2021-07-06 MED ORDER — MIDAZOLAM HCL 2 MG/2ML IJ SOLN
INTRAMUSCULAR | Status: AC
  Filled 2021-07-06: qty 2

## 2021-07-06 MED ORDER — LIDOCAINE-EPINEPHRINE 1 %-1:100000 IJ SOLN
INTRAMUSCULAR | Status: DC | PRN
  Administered 2021-07-06: 5 mL

## 2021-07-06 MED ORDER — PHENYLEPHRINE 80 MCG/ML (10ML) SYRINGE FOR IV PUSH (FOR BLOOD PRESSURE SUPPORT)
PREFILLED_SYRINGE | INTRAVENOUS | Status: DC | PRN
Start: 2021-07-06 — End: 2021-07-06
  Administered 2021-07-06: 160 ug via INTRAVENOUS
  Administered 2021-07-06: 40 ug via INTRAVENOUS

## 2021-07-06 MED ORDER — VANCOMYCIN HCL IN DEXTROSE 1-5 GM/200ML-% IV SOLN
1000.0000 mg | INTRAVENOUS | Status: AC
  Administered 2021-07-06: 1000 mg via INTRAVENOUS
  Filled 2021-07-06: qty 200

## 2021-07-06 MED ORDER — MIDAZOLAM HCL 2 MG/2ML IJ SOLN
INTRAMUSCULAR | Status: DC | PRN
  Administered 2021-07-06 (×2): 2 mg via INTRAVENOUS

## 2021-07-06 MED ORDER — VANCOMYCIN HCL 500 MG/100ML IV SOLN
500.0000 mg | Freq: Once | INTRAVENOUS | Status: AC
  Administered 2021-07-06: 500 mg via INTRAVENOUS
  Filled 2021-07-06: qty 100

## 2021-07-06 MED ORDER — LIDOCAINE-EPINEPHRINE 1 %-1:100000 IJ SOLN
INTRAMUSCULAR | Status: AC
  Filled 2021-07-06: qty 1

## 2021-07-06 MED ORDER — BUPIVACAINE-EPINEPHRINE (PF) 0.5% -1:200000 IJ SOLN
INTRAMUSCULAR | Status: DC | PRN
  Administered 2021-07-06: 5 mL

## 2021-07-06 MED ORDER — FENTANYL CITRATE (PF) 100 MCG/2ML IJ SOLN
25.0000 ug | INTRAMUSCULAR | Status: DC | PRN
  Administered 2021-07-06: 25 ug via INTRAVENOUS

## 2021-07-06 MED ORDER — MICROFIBRILLAR COLL HEMOSTAT EX PADS
MEDICATED_PAD | CUTANEOUS | Status: DC | PRN
  Administered 2021-07-06: 1 via TOPICAL

## 2021-07-06 MED ORDER — LABETALOL HCL 5 MG/ML IV SOLN: 10.0000 mg | INTRAVENOUS | Status: DC | PRN

## 2021-07-06 MED ORDER — BUPIVACAINE-EPINEPHRINE 0.5% -1:200000 IJ SOLN
INTRAMUSCULAR | Status: AC
  Filled 2021-07-06: qty 1

## 2021-07-06 MED ORDER — PHENYLEPHRINE HCL-NACL 20-0.9 MG/250ML-% IV SOLN
INTRAVENOUS | Status: DC | PRN
  Administered 2021-07-06: 30 ug/min via INTRAVENOUS

## 2021-07-06 MED ORDER — CHLORHEXIDINE GLUCONATE CLOTH 2 % EX PADS
6.0000 | MEDICATED_PAD | Freq: Every day | CUTANEOUS | Status: DC
  Administered 2021-07-06 – 2021-07-08 (×3): 6 via TOPICAL

## 2021-07-06 MED ORDER — THROMBIN 5000 UNITS EX SOLR: OROMUCOSAL | Status: DC | PRN

## 2021-07-06 MED ORDER — ONDANSETRON HCL 4 MG/2ML IJ SOLN
INTRAMUSCULAR | Status: DC | PRN
  Administered 2021-07-06: 4 mg via INTRAVENOUS

## 2021-07-06 MED ORDER — SODIUM CHLORIDE 0.9 % IV SOLN: INTRAVENOUS | Status: DC | PRN

## 2021-07-06 MED ORDER — LEVETIRACETAM IN NACL 500 MG/100ML IV SOLN
500.0000 mg | Freq: Two times a day (BID) | INTRAVENOUS | Status: DC
  Administered 2021-07-06 – 2021-07-07 (×2): 500 mg via INTRAVENOUS
  Filled 2021-07-06 (×2): qty 100

## 2021-07-06 MED ORDER — FENTANYL CITRATE (PF) 100 MCG/2ML IJ SOLN
INTRAMUSCULAR | Status: AC
  Filled 2021-07-06: qty 2

## 2021-07-06 MED ORDER — 0.9 % SODIUM CHLORIDE (POUR BTL) OPTIME
TOPICAL | Status: DC | PRN
  Administered 2021-07-06: 3000 mL

## 2021-07-06 MED ORDER — LIDOCAINE 2% (20 MG/ML) 5 ML SYRINGE
INTRAMUSCULAR | Status: DC | PRN
  Administered 2021-07-06: 60 mg via INTRAVENOUS

## 2021-07-06 MED ORDER — SUGAMMADEX SODIUM 200 MG/2ML IV SOLN
INTRAVENOUS | Status: DC | PRN
  Administered 2021-07-06: 200 mg via INTRAVENOUS

## 2021-07-06 MED ORDER — DEXAMETHASONE SODIUM PHOSPHATE 10 MG/ML IJ SOLN
INTRAMUSCULAR | Status: DC | PRN
  Administered 2021-07-06: 10 mg via INTRAVENOUS

## 2021-07-06 MED ORDER — OXYCODONE HCL 5 MG PO TABS: 5.0000 mg | ORAL_TABLET | Freq: Once | ORAL | Status: DC | PRN

## 2021-07-06 MED ORDER — HYDROCODONE-ACETAMINOPHEN 5-325 MG PO TABS
1.0000 | ORAL_TABLET | ORAL | Status: DC | PRN
  Administered 2021-07-06 – 2021-07-07 (×4): 1 via ORAL
  Filled 2021-07-06 (×4): qty 1

## 2021-07-06 MED ORDER — ONDANSETRON HCL 4 MG/2ML IJ SOLN
INTRAMUSCULAR | Status: AC
  Filled 2021-07-06: qty 2

## 2021-07-06 MED ORDER — ONDANSETRON HCL 4 MG/2ML IJ SOLN
4.0000 mg | INTRAMUSCULAR | Status: DC | PRN
  Administered 2021-07-07 – 2021-07-08 (×2): 4 mg via INTRAVENOUS
  Filled 2021-07-06 (×2): qty 2

## 2021-07-06 MED ORDER — SODIUM CHLORIDE 0.9 % IV SOLN
INTRAVENOUS | Status: DC | PRN
Start: 2021-07-06 — End: 2021-07-06

## 2021-07-06 MED ORDER — ROCURONIUM BROMIDE 10 MG/ML (PF) SYRINGE
PREFILLED_SYRINGE | INTRAVENOUS | Status: DC | PRN
  Administered 2021-07-06 (×2): 50 mg via INTRAVENOUS
  Administered 2021-07-06: 30 mg via INTRAVENOUS

## 2021-07-06 MED ORDER — MANNITOL 25 % IV SOLN
INTRAVENOUS | Status: DC | PRN
  Administered 2021-07-06: 50 g via INTRAVENOUS

## 2021-07-06 SURGICAL SUPPLY — 72 items
BAG COUNTER SPONGE SURGICOUNT (BAG) ×3 IMPLANT
BAND RUBBER #18 3X1/16 STRL (MISCELLANEOUS) ×6 IMPLANT
BIT DRILL WIRE PASS 1.3MM (BIT) IMPLANT
BLADE CLIPPER SURG (BLADE) ×3 IMPLANT
BUR CARBIDE MATCH 3.0 (BURR) ×3 IMPLANT
BUR SPIRAL ROUTER 2.3 (BUR) ×3 IMPLANT
CANISTER SUCT 3000ML PPV (MISCELLANEOUS) ×3 IMPLANT
COVER BURR HOLE UNIV 10 (Orthopedic Implant) ×3 IMPLANT
DIFFUSER DRILL AIR PNEUMATIC (MISCELLANEOUS) ×3 IMPLANT
DRAIN JACKSON RD 7FR 3/32 (WOUND CARE) IMPLANT
DRAIN JP 10F RND RADIO (DRAIN) IMPLANT
DRAPE MICROSCOPE LEICA (MISCELLANEOUS) ×3 IMPLANT
DRAPE NEUROLOGICAL W/INCISE (DRAPES) ×3 IMPLANT
DRAPE SHEET LG 3/4 BI-LAMINATE (DRAPES) ×3 IMPLANT
DRAPE WARM FLUID 44X44 (DRAPES) ×3 IMPLANT
DRILL WIRE PASS 1.3MM (BIT)
DRSG TELFA 3X8 NADH (GAUZE/BANDAGES/DRESSINGS) ×3 IMPLANT
DURAPREP 6ML APPLICATOR 50/CS (WOUND CARE) ×3 IMPLANT
ELECT COATED BLADE 2.86 ST (ELECTRODE) ×3 IMPLANT
ELECT REM PT RETURN 9FT ADLT (ELECTROSURGICAL) ×3
ELECTRODE REM PT RTRN 9FT ADLT (ELECTROSURGICAL) ×2 IMPLANT
FORCEPS BIPOLAR SPETZLER 8 1.0 (NEUROSURGERY SUPPLIES) ×1 IMPLANT
GAUZE 4X4 16PLY ~~LOC~~+RFID DBL (SPONGE) ×2 IMPLANT
GLOVE BIOGEL PI IND STRL 8 (GLOVE) ×4 IMPLANT
GLOVE BIOGEL PI INDICATOR 8 (GLOVE) ×2
GLOVE SURG SS PI 7.5 STRL IVOR (GLOVE) ×5 IMPLANT
GLOVE SURG SS PI 8.0 STRL IVOR (GLOVE) ×3 IMPLANT
GLOVE SURG SS PI 8.5 STRL IVOR (GLOVE) ×1
GLOVE SURG SS PI 8.5 STRL STRW (GLOVE) IMPLANT
GLOVE SURG UNDER POLY LF SZ8.5 (GLOVE) ×6 IMPLANT
GOWN STRL REUS W/ TWL LRG LVL3 (GOWN DISPOSABLE) IMPLANT
GOWN STRL REUS W/ TWL XL LVL3 (GOWN DISPOSABLE) ×6 IMPLANT
GOWN STRL REUS W/TWL 2XL LVL3 (GOWN DISPOSABLE) ×2 IMPLANT
GOWN STRL REUS W/TWL LRG LVL3 (GOWN DISPOSABLE) ×1
GOWN STRL REUS W/TWL XL LVL3 (GOWN DISPOSABLE) ×3
GRAFT DURAGEN MATRIX 2WX2L ×1 IMPLANT
HEMOSTAT POWDER KIT SURGIFOAM (HEMOSTASIS) ×4 IMPLANT
HEMOSTAT SNOW SURGICEL 2X4 (HEMOSTASIS) IMPLANT
HEMOSTAT SURGICEL 2X14 (HEMOSTASIS) ×3 IMPLANT
KIT BASIN OR (CUSTOM PROCEDURE TRAY) ×3 IMPLANT
KIT TURNOVER KIT B (KITS) ×3 IMPLANT
MARKER SPHERE PSV REFLC 13MM (MARKER) ×8 IMPLANT
NEEDLE HYPO 22GX1.5 SAFETY (NEEDLE) ×3 IMPLANT
NS IRRIG 1000ML POUR BTL (IV SOLUTION) ×5 IMPLANT
PACK CRANIOTOMY CUSTOM (CUSTOM PROCEDURE TRAY) ×3 IMPLANT
PAD DRESSING TELFA 3X8 NADH (GAUZE/BANDAGES/DRESSINGS) IMPLANT
PATTIES SURGICAL .5 X.5 (GAUZE/BANDAGES/DRESSINGS) IMPLANT
PATTIES SURGICAL .5 X3 (DISPOSABLE) ×1 IMPLANT
PATTIES SURGICAL 1X1 (DISPOSABLE) IMPLANT
PERFORATOR LRG  14-11MM (BIT) ×1
PERFORATOR LRG 14-11MM (BIT) ×2 IMPLANT
PIN MAYFIELD SKULL DISP (PIN) ×3 IMPLANT
PLATE DOUBLE Y CMF 6H (Plate) ×1 IMPLANT
RETRACTOR LONE STAR DISPOSABLE (INSTRUMENTS) ×4 IMPLANT
SCREW UNIII AXS SD 1.5X4 (Screw) ×12 IMPLANT
SPONGE SURGIFOAM ABS GEL 100 (HEMOSTASIS) ×3 IMPLANT
STAPLER VISISTAT 35W (STAPLE) ×3 IMPLANT
STOCKINETTE 6  STRL (DRAPES) ×1
STOCKINETTE 6 STRL (DRAPES) ×2 IMPLANT
STRIP CLOSURE SKIN 1/2X4 (GAUZE/BANDAGES/DRESSINGS) ×3 IMPLANT
SUT ETHILON 3 0 FSL (SUTURE) IMPLANT
SUT ETHILON 3 0 PS 1 (SUTURE) IMPLANT
SUT NURALON 4 0 TR CR/8 (SUTURE) ×8 IMPLANT
SUT VIC AB 0 CT1 18XCR BRD8 (SUTURE) ×2 IMPLANT
SUT VIC AB 0 CT1 8-18 (SUTURE) ×1
SUT VIC AB 2-0 CP2 18 (SUTURE) ×3 IMPLANT
SUT VICRYL RAPIDE 4/0 PS 2 (SUTURE) ×2 IMPLANT
TOWEL GREEN STERILE (TOWEL DISPOSABLE) ×3 IMPLANT
TOWEL GREEN STERILE FF (TOWEL DISPOSABLE) ×3 IMPLANT
TUBE CONNECTING 12X1/4 (SUCTIONS) ×3 IMPLANT
UNDERPAD 30X36 HEAVY ABSORB (UNDERPADS AND DIAPERS) ×3 IMPLANT
WATER STERILE IRR 1000ML POUR (IV SOLUTION) ×3 IMPLANT

## 2021-07-06 NOTE — Anesthesia Procedure Notes (Signed)
Arterial Line Insertion Start/End5/23/2023 7:15 AM, 07/06/2021 7:20 AM Performed by: Amadeo Garnet, CRNA, CRNA  Patient location: Pre-op. Preanesthetic checklist: patient identified, IV checked, site marked, risks and benefits discussed, surgical consent, monitors and equipment checked, pre-op evaluation, timeout performed and anesthesia consent Lidocaine 1% used for infiltration and patient sedated Left, radial was placed Catheter size: 20 G Hand hygiene performed  and maximum sterile barriers used   Attempts: 2 Procedure performed without using ultrasound guided technique. Following insertion, Biopatch. Post procedure assessment: normal  Patient tolerated the procedure well with no immediate complications.

## 2021-07-06 NOTE — Anesthesia Postprocedure Evaluation (Signed)
Anesthesia Post Note  Patient: Debbie Horton  Procedure(s) Performed: LEFT OCCIPITAL CRANIOTOMY FOR TUMOR EXCISION (Head) APPLICATION OF CRANIAL NAVIGATION     Patient location during evaluation: PACU Anesthesia Type: General Level of consciousness: awake and alert Pain management: pain level controlled Vital Signs Assessment: post-procedure vital signs reviewed and stable Respiratory status: spontaneous breathing, nonlabored ventilation, respiratory function stable and patient connected to nasal cannula oxygen Cardiovascular status: blood pressure returned to baseline and stable Postop Assessment: no apparent nausea or vomiting Anesthetic complications: no   No notable events documented.  Last Vitals:  Vitals:   07/06/21 1430 07/06/21 1500  BP:  105/70  Pulse: 73 84  Resp: 12 (!) 27  Temp:    SpO2: 98% 99%    Last Pain:  Vitals:   07/06/21 1400  TempSrc:   PainSc: Asleep    LLE Motor Response: Purposeful movement (07/06/21 1500) LLE Sensation: Full sensation (07/06/21 1500) RLE Motor Response: Purposeful movement (07/06/21 1500) RLE Sensation: Full sensation (07/06/21 1500)      [name] S

## 2021-07-06 NOTE — Progress Notes (Signed)
Pharmacy Antibiotic Note  Marianela Mandrell is a 56 y.o. female admitted on 06/30/2021 with surgical prophylaxis.  Pharmacy has been consulted for vancomycin dosing.  Confirmed no drain per RN. ClCr 77m/min. Received 1g vancomycin at 8am.   Plan: Vancomycin '500mg'$  x1  Height: '5\' 3"'$  (160 cm) Weight: 59 kg (130 lb) IBW/kg (Calculated) : 52.4  Temp (24hrs), Avg:98.2 F (36.8 C), Min:97.5 F (36.4 C), Max:98.6 F (37 C)  Recent Labs  Lab 06/30/21 1932 07/01/21 0328 07/02/21 0334  WBC 8.1 8.2  --   CREATININE 0.83 0.76 0.82    Estimated Creatinine Clearance: 65.6 mL/min (by C-G formula based on SCr of 0.82 mg/dL).    Allergies  Allergen Reactions   Latex Rash   Penicillins Rash   Phenergan [Promethazine] Palpitations   Sulfa Antibiotics Rash     Thank you for allowing pharmacy to be a part of this patient's care.  LBenetta Spar PharmD, BCPS, BCCP Clinical Pharmacist  Please check AMION for all [place] numbers After 10:00 PM, call [phone]

## 2021-07-06 NOTE — Anesthesia Procedure Notes (Signed)
Procedure Name: Intubation Date/Time: 07/06/2021 7:59 AM Performed by: Amadeo Garnet, CRNA Pre-anesthesia Checklist: Patient identified, Emergency Drugs available, Suction available and Patient being monitored Patient Re-evaluated:Patient Re-evaluated prior to induction Oxygen Delivery Method: Circle system utilized Preoxygenation: Pre-oxygenation with 100% oxygen Induction Type: IV induction Ventilation: Mask ventilation without difficulty Laryngoscope Size: Glidescope and 4 Grade View: Grade I Tube type: Oral Tube size: 7.0 mm Number of attempts: 2 Airway Equipment and Method: Stylet and Oral airway Placement Confirmation: ETT inserted through vocal cords under direct vision, positive ETCO2 and breath sounds checked- equal and bilateral Secured at: 22 cm Tube secured with: Tape Dental Injury: Teeth and Oropharynx as per pre-operative assessment  Comments: DL x1 grade 3 view, only able to visualize arytenoids/epiglottis, decision made to use glidescope- DL x1 with grade 1 view. ETT placed atraumatically. VSS

## 2021-07-06 NOTE — Op Note (Signed)
Providing Compassionate, Quality Care - Together  Date of service: 07/06/2021  PREOP DIAGNOSIS:  Left occipital intra-axial tumor  POSTOP DIAGNOSIS: Same  PROCEDURE: Left occipital craniotomy for resection of intra-axial tumor Intraoperative use of microscope for microdissection Intraoperative use of stereotaxy, [facility]  SURGEON: Dr. Pieter Partridge C. Jontay Maston, DO  ASSISTANT: Dr. Kristeen Miss, MD; Weston Brass, NP  ANESTHESIA: General Endotracheal  EBL: 400 cc  SPECIMENS: Left occipital tumor  DRAINS: None  COMPLICATIONS: None  CONDITION: Hemodynamically stable  HISTORY: Debbie Horton is a 56 y.o. female right-handed, that presented with with intermittent headaches, word confusion as well as right hemianopsia.  CT and MRI of the brain revealed a left occipital tumor, metastatic work-up was negative.  MRI revealed a ring-enhancing with necrotic center with surrounding edema and mild midline shift.  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  We discussed all risks, benefits and expected outcomes as well as alternatives to treatment.  Informed consent was obtained.  PROCEDURE IN DETAIL: The patient was brought to the operating room. After induction of general anesthesia, [place] head holder was placed and the patient was positioned on the operative table in the prone position. All pressure points were meticulously padded.  The patient was registered to the neuro navigation with excellent accuracy which was verified with anatomical landmarks.  The left occipital region was clipped free of hair.  Skin incision was then marked out and prepped and draped in the usual sterile fashion. Physician driven timeout was performed.  Using a 10 blade, incision was made sharply down to the pericranium.  Raney clips were applied.  Elevator was used to expose the cranium.   Retractor was placed in the wound.  Craniotomy was planned with neuro navigation.  This was performed in a standard fashion with a high-speed drill and elevated carefully.  Epidural hemostasis was achieved with Surgifoam.  Again confirming appropriate trajectory, neuro navigation was used and then a cruciate durotomy was performed.  4-0 Nurolon's were placed for retraction and the dura.  Microscope was sterilely draped and brought into the field.  A corticectomy was performed with bipolar cautery and microscissors.  White matter dissection was performed with bipolar cautery and suction and the tumor was identified.  The pseudocapsule was noted to have multiple thrombosed veins as well as small perivascular feeders which were coagulated and cut circumferentially with bipolar and microscissors.  The superior and lateral portion of the tumor was dissected out and cottonoids were placed for retention.  I then carried my dissection along the inferior region of the tumor along the normal white matter border.  This was then carried medially into the depth of the tumor.  Using neuro navigation, I appear to be supramarginal in all directions as well as the depth.  The tumor was piecemeal removed with tumor forceps and was noted to have significant amount of necrosis, deep purple/pink type tumor.  It was then elevated from the resection cavity and sent for permanent pathology.  Using bipolar cautery and suction technique, circumferentially the resection cavity was followed superiorly, and the depth, medially, inferiorly and laterally.  There was normal white matter in all directions.  Hemostasis was achieved with bipolar cautery.  Using neuro navigation, I verified I was supramarginal to the ring-enhancing portion of the tumor in all directions.  The wound was monitored for series of minutes and copiously irrigated and noted to be excellently hemostatic.  Surgicel was used to align the resection cavity.  Again the  resection cavity was noted to be excellently hemostatic.  The dural retractors were removed and the dura was closed with 4-0 interrupted Nurolon suture.  Epidural hemostasis was achieved with Surgifoam and bipolar cautery.  DuraGen was placed over the durotomy site.  The craniotomy flap was then placed in its original position and affixed with the cranial plating system.  Retractor was removed and Raney clips were removed.  2-0 Vicryl's were used to close the galea, skin was closed with staples.  Sterile dressing was applied.  At the end of the case all sponge, needle, and instrument counts were correct. The patient was then transferred to the stretcher, extubated, and taken to the post-anesthesia care unit in stable hemodynamic condition.

## 2021-07-06 NOTE — Progress Notes (Signed)
   Providing Compassionate, Quality Care - Together  NEUROSURGERY PROGRESS NOTE   S: No issues overnight.   O: EXAM:  BP 111/79 (BP Location: Right Arm)   Pulse 67   Temp 98.1 F (36.7 C) (Oral)   Resp 16   Ht '5\' 3"'$  (1.6 m)   Wt 59 kg   SpO2 99%   BMI 23.03 kg/m   Awake, alert, oriented x3 PERRL Speech fluent, appropriate  CNs grossly intact  Right hemianopsia stable 5/5 BUE/BLE    ASSESSMENT:  56 y.o. 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  Please do not hesitate to call with questions or concerns.   Elwin Sleight, [facility] Cell: [phone]

## 2021-07-06 NOTE — Progress Notes (Signed)
Pt was taken to OR at 0648, attempted to call report to Silas Flood at [phone] as directed by OR nurse, no reply, said yet to log in, will attempt again. Ob[place]-Asidi, Megyn Leng Efe

## 2021-07-06 NOTE — Transfer of Care (Signed)
Immediate Anesthesia Transfer of Care Note  Patient: Debbie Horton  Procedure(s) Performed: LEFT OCCIPITAL CRANIOTOMY FOR TUMOR EXCISION (Head) APPLICATION OF CRANIAL NAVIGATION  Patient Location: PACU  Anesthesia Type:General  Level of Consciousness: awake and alert   Airway & Oxygen Therapy: Patient Spontanous Breathing and Patient connected to face mask oxygen  Post-op Assessment: Report given to RN, Post -op Vital signs reviewed and stable and Patient moving all extremities X 4  Post vital signs: Reviewed and stable  Last Vitals:  Vitals Value Taken Time  BP 142/84 07/06/21 1157  Temp    Pulse 102 07/06/21 1205  Resp 15 07/06/21 1205  SpO2 100 % 07/06/21 1205  Vitals shown include unvalidated device data.  Last Pain:  Vitals:   07/06/21 0438  TempSrc: Oral  PainSc:          Complications: No notable events documented.

## 2021-07-06 NOTE — Anesthesia Preprocedure Evaluation (Signed)
Anesthesia Evaluation  Patient identified by MRN, date of birth, ID band Patient awake    Reviewed: Allergy & Precautions, H&P , NPO status , Patient's Chart, lab work & pertinent test results  Airway Mallampati: II   Neck ROM: full    Dental   Pulmonary neg pulmonary ROS,    breath sounds clear to auscultation       Cardiovascular negative cardio ROS   Rhythm:regular Rate:Normal     Neuro/Psych Occipital brain mass    GI/Hepatic   Endo/Other    Renal/GU      Musculoskeletal   Abdominal   Peds  Hematology   Anesthesia Other Findings   Reproductive/Obstetrics                             Anesthesia Physical Anesthesia Plan  ASA: 2  Anesthesia Plan: General   Post-op Pain Management:    Induction: Intravenous  PONV Risk Score and Plan: 3 and Ondansetron, Dexamethasone, Midazolam and Treatment may vary due to age or medical condition  Airway Management Planned: Oral ETT  Additional Equipment: Arterial line  Intra-op Plan:   Post-operative Plan: Extubation in OR  Informed Consent: I have reviewed the patients History and Physical, chart, labs and discussed the procedure including the risks, benefits and alternatives for the proposed anesthesia with the patient or authorized representative who has indicated his/her understanding and acceptance.     Dental advisory given  Plan Discussed with: CRNA, Anesthesiologist and Surgeon  Anesthesia Plan Comments:         Anesthesia Quick Evaluation

## 2021-07-06 NOTE — Progress Notes (Signed)
   Providing Compassionate, Quality Care - Together  NEUROSURGERY PROGRESS NOTE   S: No issues overnight.   O: EXAM:  BP 111/79 (BP Location: Right Arm)   Pulse 67   Temp 98.1 F (36.7 C) (Oral)   Resp 16   Ht '5\' 3"'$  (1.6 m)   Wt 59 kg   SpO2 99%   BMI 23.03 kg/m   Awake, alert, oriented x3 PERRL Speech fluent, appropriate  CNs grossly intact  Right hemianopsia stable 5/5 BUE/BLE    ASSESSMENT:  56 y.o. 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  We discussed all risks, benefits and expected outcomes and alternatives.  Informed consent was obtained.    Thank you for allowing me to participate in this patient's care.  Please do not hesitate to call with questions or concerns.   Elwin Sleight, [facility] Cell: [phone]

## 2021-07-07 ENCOUNTER — Encounter (HOSPITAL_COMMUNITY): Payer: Self-pay | Admitting: Neurological Surgery

## 2021-07-07 ENCOUNTER — Inpatient Hospital Stay (HOSPITAL_COMMUNITY): Payer: 59

## 2021-07-07 LAB — CBC
HCT: 33.6 % — ABNORMAL LOW (ref 36.0–46.0)
Hemoglobin: 11.4 g/dL — ABNORMAL LOW (ref 12.0–15.0)
MCH: 31 pg (ref 26.0–34.0)
MCHC: 33.9 g/dL (ref 30.0–36.0)
MCV: 91.3 fL (ref 80.0–100.0)
Platelets: 184 10*3/uL (ref 150–400)
RBC: 3.68 MIL/uL — ABNORMAL LOW (ref 3.87–5.11)
RDW: 11.4 % — ABNORMAL LOW (ref 11.5–15.5)
WBC: 16.2 10*3/uL — ABNORMAL HIGH (ref 4.0–10.5)
nRBC: 0 % (ref 0.0–0.2)

## 2021-07-07 LAB — BASIC METABOLIC PANEL
Anion gap: 6 (ref 5–15)
BUN: 15 mg/dL (ref 6–20)
CO2: 24 mmol/L (ref 22–32)
Calcium: 8.1 mg/dL — ABNORMAL LOW (ref 8.9–10.3)
Chloride: 107 mmol/L (ref 98–111)
Creatinine, Ser: 0.66 mg/dL (ref 0.44–1.00)
GFR, Estimated: >60 mL/min (ref >60)
Glucose, Bld: 122 mg/dL — ABNORMAL HIGH (ref 70–99)
Potassium: 3.8 mmol/L (ref 3.5–5.1)
Sodium: 137 mmol/L (ref 135–145)

## 2021-07-07 LAB — SURGICAL PATHOLOGY

## 2021-07-07 IMAGING — MR MR HEAD WO/W CM
14 of 16 series · 42 of 48 positions shown · IV contrast (Gadavist)
Comparison: Five days prior

CLINICAL DATA: Left occipital craniotomy for tumor excision, postop

EXAM:
MRI HEAD WITHOUT AND WITH CONTRAST
TECHNIQUE: Multiplanar, multiecho pulse sequences of the brain and surrounding
structures were obtained without and with intravenous contrast.
CONTRAST:  6mL GADAVIST GADOBUTROL 1 MMOL/ML IV SOLN

[Series 5: DWI · axial · 3.0mm · 0.92mm/px · z∈[-77,+85]mm · 8 of 107 slices shown (1 of 4)]
[im 1/107]
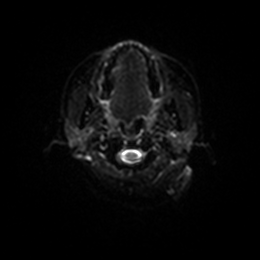
[im 16/107]
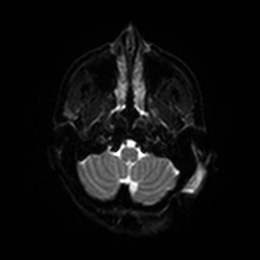
[im 31/107]
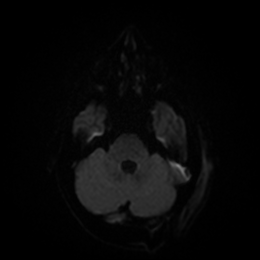
[im 46/107]
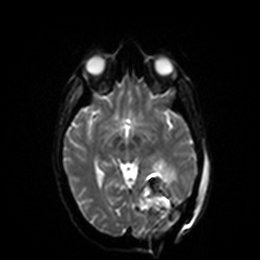
[im 61/107]
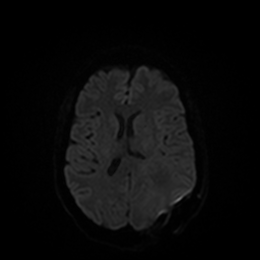
[im 76/107]
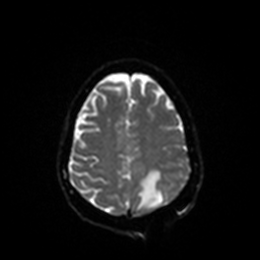
[im 91/107]
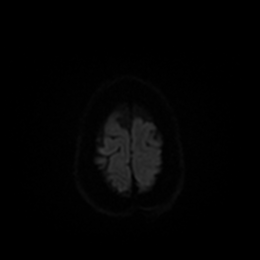
[im 107/107]
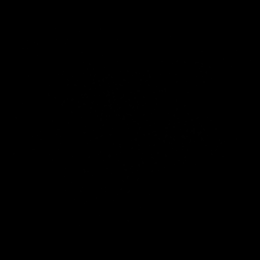

[Series 6: DWI · axial · 3.0mm · 0.92mm/px · z∈[-77,+79]mm · 3 of 53 slices shown (2 of 4)]
[im 1/53]
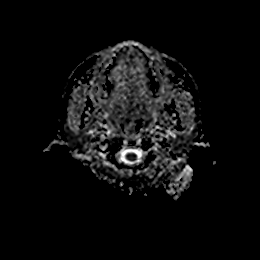
[im 27/53]
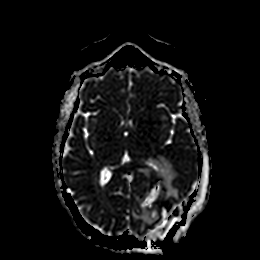
[im 53/53]
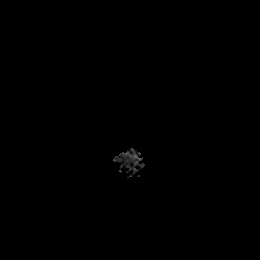

[Series 7: DWI · coronal · 4.0mm · 0.88mm/px · 5 of 82 slices shown (3 of 4)]
[im 1/82]
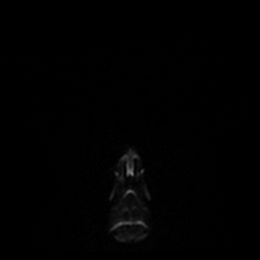
[im 21/82]
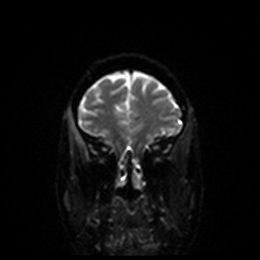
[im 41/82]
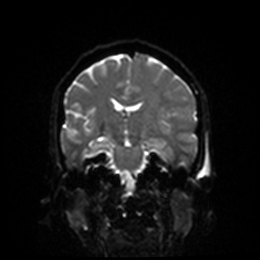
[im 61/82]
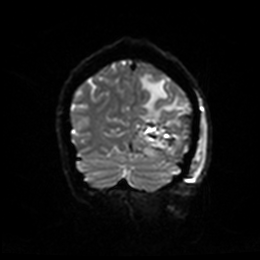
[im 82/82]
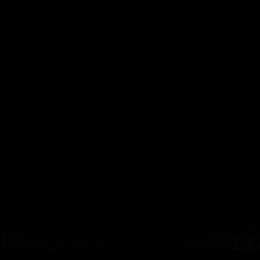

[Series 8: DWI · coronal · 4.0mm · 0.88mm/px · 2 of 40 slices shown (4 of 4)]
[im 1/40]
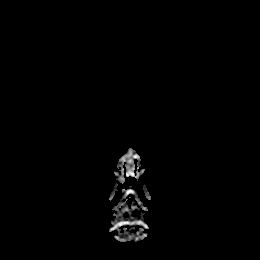
[im 40/40]
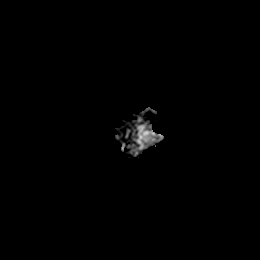

[Series 9: T1 · sagittal · 5.0mm · 0.75mm/px · 2 of 26 slices shown]
[im 1/26]
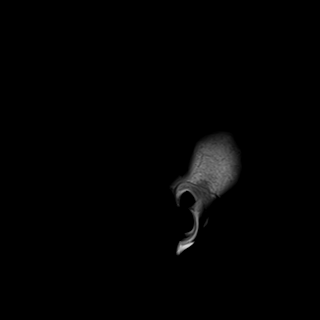
[im 26/26]
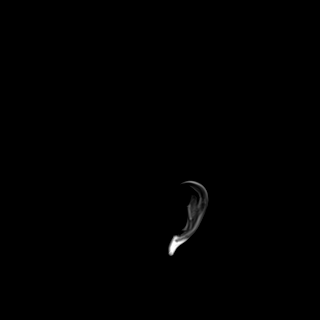

[Series 10: T2 · axial · 5.0mm · 0.75mm/px · z∈[-76,+79]mm · 2 of 27 slices shown]
[im 1/27]
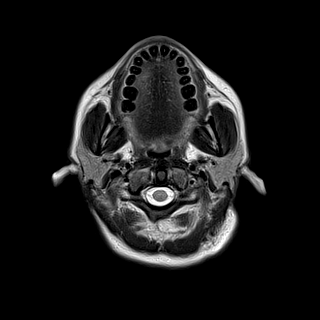
[im 27/27]
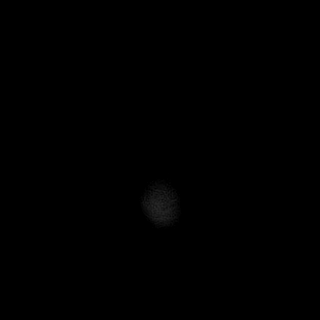

[Series 11: FLAIR · axial · 5.0mm · 0.47mm/px · z∈[-78,+78]mm · 2 of 27 slices shown]
[im 1/27]
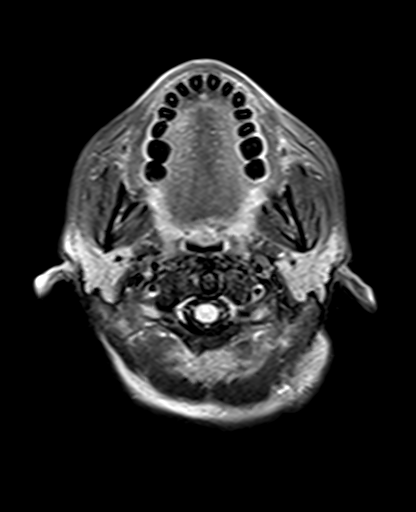
[im 27/27]
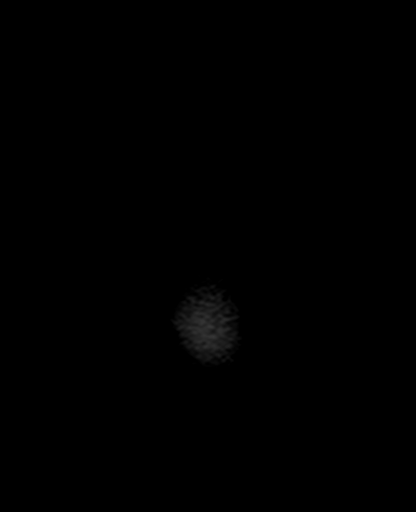

[Series 12: mag_images · axial · 3.0mm · 0.94mm/px · z∈[-82,+83]mm · 3 of 56 slices shown]
[im 1/56]
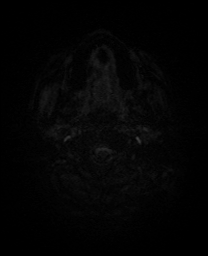
[im 28/56]
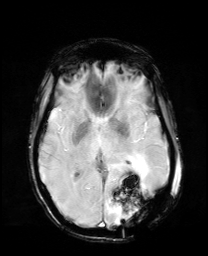
[im 56/56]
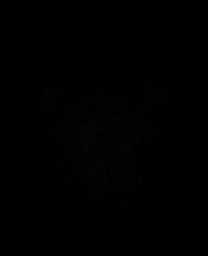

[Series 13: pha_images · axial · 3.0mm · 0.94mm/px · z∈[-82,+83]mm · 3 of 55 slices shown]
[im 1/55]
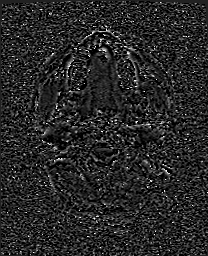
[im 28/55]
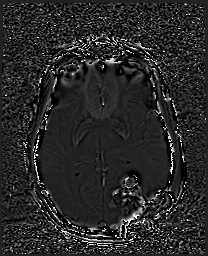
[im 55/55]
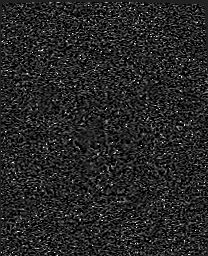

[Series 14: swi_images · axial · 3.0mm · 0.94mm/px · z∈[-82,+83]mm · 3 of 56 slices shown]
[im 1/56]
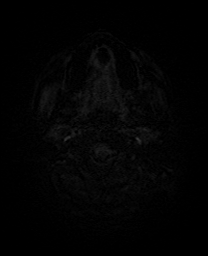
[im 28/56]
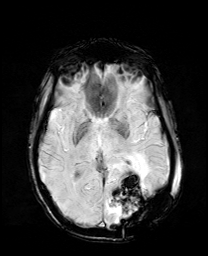
[im 56/56]
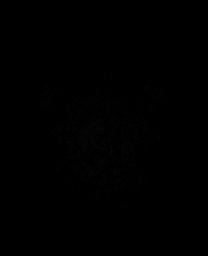

[Series 15: mip_images(sw) · axial · 24.0mm · 0.94mm/px · z∈[-71,+72]mm · 3 of 49 slices shown]
[im 1/49]
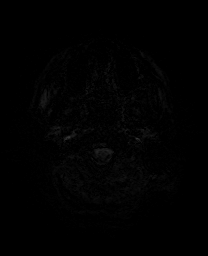
[im 25/49]
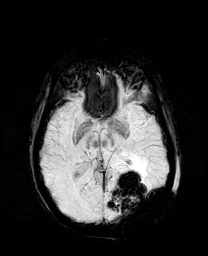
[im 49/49]
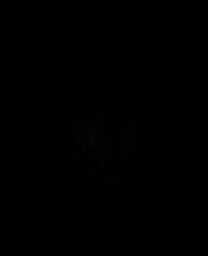

[Series 17: T2 post-contrast · coronal · 5.0mm · 0.72mm/px · 2 of 34 slices shown]
[im 1/34]
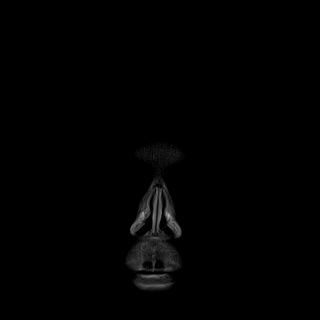
[im 34/34]
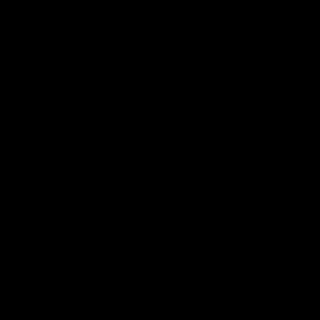

[Series 19: T1 post-contrast · coronal · 5.0mm · 0.34mm/px · 2 of 34 slices shown (1 of 2)]
[im 1/34]
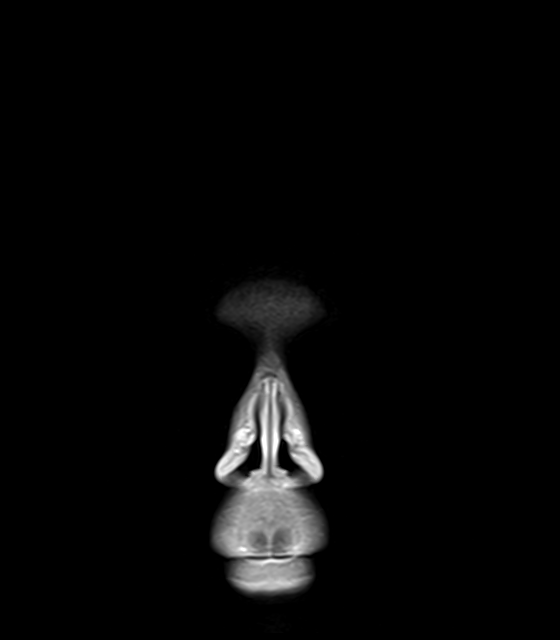
[im 34/34]
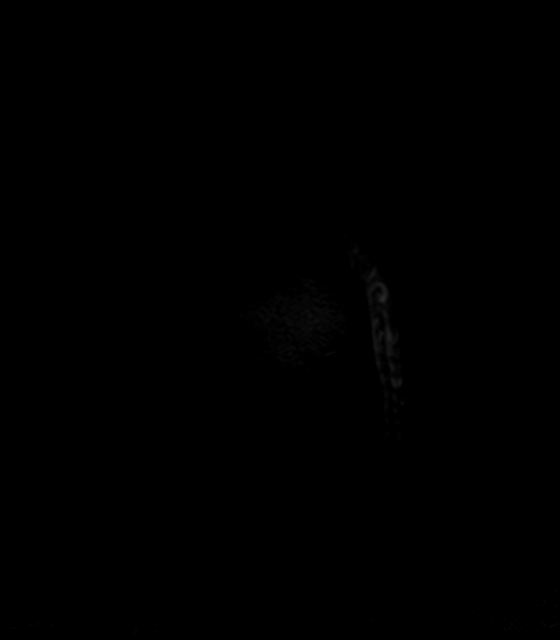

[Series 20: T1 post-contrast · sagittal · 5.0mm · 0.75mm/px · 2 of 27 slices shown (2 of 2)]
[im 1/27]
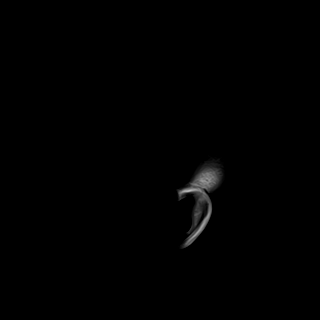
[im 27/27]
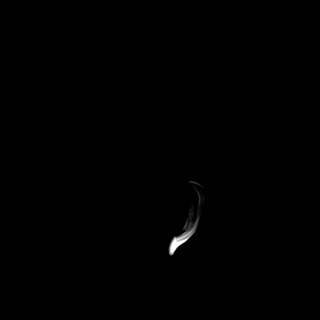

[42 of 48 positions shown; findings below may reference images not displayed]

FINDINGS: Brain: Resected left occipital mass with expected blood products in
the operative cavity. Small area of peripheral enhancement at the
lower and medial aspect measuring 6 mm on [DATE]; other areas of
postcontrast hyperintensity are attributed to intrinsic T1
shortening from methemoglobin. No complicating infarct or dural
sinus thrombosis. Non worrisome fluid collection deep to the
craniotomy flap.

Vascular: Major flow voids and vascular enhancements are preserved

Skull and upper cervical spine: Unremarkable craniotomy

Sinuses/Orbits: Negative
IMPRESSION: No complicating features of left occipital mass resection. Only
subcentimeter enhancement is present along the lower and medial
aspect of the cavity.

## 2021-07-07 MED ORDER — GADOBUTROL 1 MMOL/ML IV SOLN
6.0000 mL | Freq: Once | INTRAVENOUS | Status: AC | PRN
  Administered 2021-07-07: 6 mL via INTRAVENOUS

## 2021-07-07 MED ORDER — PANTOPRAZOLE SODIUM 40 MG PO TBEC
40.0000 mg | DELAYED_RELEASE_TABLET | Freq: Every day | ORAL | Status: DC
Start: 2021-07-07 — End: 2021-07-08
  Administered 2021-07-07: 40 mg via ORAL
  Filled 2021-07-07: qty 1

## 2021-07-07 MED ORDER — LEVETIRACETAM 500 MG PO TABS
500.0000 mg | ORAL_TABLET | Freq: Two times a day (BID) | ORAL | Status: DC
  Administered 2021-07-07 – 2021-07-08 (×3): 500 mg via ORAL
  Filled 2021-07-07 (×3): qty 1

## 2021-07-07 MED ORDER — SIMETHICONE 80 MG PO CHEW
80.0000 mg | CHEWABLE_TABLET | Freq: Four times a day (QID) | ORAL | Status: DC | PRN
Start: 2021-07-07 — End: 2021-07-08
  Administered 2021-07-07: 80 mg via ORAL
  Filled 2021-07-07: qty 1

## 2021-07-07 MED ORDER — DEXAMETHASONE 4 MG PO TABS
4.0000 mg | ORAL_TABLET | Freq: Three times a day (TID) | ORAL | Status: DC
  Administered 2021-07-07 – 2021-07-08 (×4): 4 mg via ORAL
  Filled 2021-07-07 (×4): qty 1

## 2021-07-07 MED FILL — Thrombin For Soln 5000 Unit: CUTANEOUS | Qty: 5000 | Status: AC

## 2021-07-07 NOTE — Progress Notes (Signed)
   Providing Compassionate, Quality Care - Together  NEUROSURGERY PROGRESS NOTE   S: No issues overnight. Sitting in bed  O: EXAM:  BP 116/75   Pulse 81   Temp 98.3 F (36.8 C) (Oral)   Resp 17   Ht '5\' 3"'$  (1.6 m)   Wt 59 kg   SpO2 98%   BMI 23.03 kg/m   Awake, alert, oriented x3 PERRL Speech fluent, appropriate  CNs grossly intact  R hemianopsia Dressing intact 5/5 BUE/BLE   ASSESSMENT:  56 y.o. female with   L occipital tumor  S/p L crani for tumor  PLAN: - cont icu - pain control - mri reviewed - doing well - pt/ot    Thank you for allowing me to participate in this patient's care.  Please do not hesitate to call with questions or concerns.   Elwin Sleight, [facility] Cell: [phone]

## 2021-07-07 NOTE — Evaluation (Signed)
Occupational Therapy Evaluation Patient Details Name: Debbie Horton MRN: [id number] DOB: 01-19-1966 Today's Date: 07/07/2021   History of Present Illness Pt is a 56yo female who presented to ED with vision changes, headache, and feeling "off". MRI revealed L occipital tumor. Pt underwent excision on 5/23. 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 PMH: unremarkable   Clinical Impression   PTA pt lives independently with her husband and works at her [employer]. Pt with significant R filed deficit affecting her ADL, IADL and functional mobility. Pt reports "words are moving around" when trying to read large font.  In addition to vision changes, but demonstrates difficulty with insight and awareness into deficits and how they affect her functionally. Began educating pt on compensatory strategies for visual deficits. Acute OT to follow and recommend outpt OT at a neruo outpt clinic.      Recommendations for follow up therapy are one component of a multi-disciplinary discharge planning process, led by the attending physician.  Recommendations may be updated based on patient status, additional functional criteria and insurance authorization.   Follow Up Recommendations  Outpatient OT (neuro outpt)  Refrain from driving   Assistance Recommended at Discharge Intermittent Supervision/Assistance  Patient can return home with the following A little help with walking and/or transfers;A little help with bathing/dressing/bathroom;Assistance with cooking/housework;Direct supervision/assist for medications management;Direct supervision/assist for financial management;Assist for transportation;Help with stairs or ramp for entrance    Functional Status Assessment  Patient has had a recent decline in their functional status and demonstrates the ability to make significant improvements in function in a reasonable and predictable amount of time.  Equipment Recommendations  None recommended by OT    Recommendations for Other Services        Precautions / Restrictions Precautions Precautions: Fall Precaution Comments: R filed cut      Mobility Bed Mobility               General bed mobility comments: OOB in chair    Transfers                   General transfer comment: minA for safety due to vision impairment, pt powered up well, slow and guarded due to first time up      Balance Overall balance assessment: Needs assistance Sitting-balance support: Feet unsupported, Single extremity supported Sitting balance-Leahy Scale: Good     Standing balance support: Single extremity supported, During functional activity Standing balance-Leahy Scale: Fair Standing balance comment: benefit from support due to impaired vision                           ADL either performed or assessed with clinical judgement   ADL Overall ADL's : Needs assistance/impaired                                     Functional mobility during ADLs: Min guard General ADL Comments: overall min guard/set up with ADL tasks     Vision Baseline Vision/History: 1 Wears glasses Patient Visual Report: Blurring of vision;Peripheral vision impairment;Central vision impairment Vision Assessment?: Yes Eye Alignment: Within Functional Limits Ocular Range of Motion: Within Functional Limits Alignment/Gaze Preference: Within Defined Limits Tracking/Visual Pursuits: Decreased smoothness of horizontal tracking;Decreased smoothness of vertical tracking;Decreased smoothness of eye movement to RIGHT inferior field Saccades: Additional head turns occurred during testing;Decreased speed of saccadic movement (does not automatically turn head to compensate adn turn head  to R) Convergence: Within functional limits Visual Fields: Right visual field deficit Additional Comments: States she is unable to see the L side of my face; difficulty with reading; states "words move around"; has been unabl eot use her cell phone      Perception Perception Comments: diffiuclty wiht position in space   Praxis      Pertinent Vitals/Pain Pain Assessment Pain Location: incision, L posterior head Pain Descriptors / Indicators: Headache     Hand Dominance Right   Extremity/Trunk Assessment Upper Extremity Assessment Upper Extremity Assessment: Overall WFL for tasks assessed   Lower Extremity Assessment Lower Extremity Assessment: Defer to PT evaluation   Cervical / Trunk Assessment Cervical / Trunk Assessment: Other exceptions Cervical / Trunk Exceptions: brain surgery   Communication Communication Communication: No difficulties (at times appeared to have word finding deficits)   Cognition Arousal/Alertness: Awake/alert (sleepy as expected after surgery) Behavior During Therapy: WFL for tasks assessed/performed Overall Cognitive Status: Impaired/Different from baseline Area of Impairment: Memory, Attention                   Current Attention Level: Focused Memory: Decreased short-term memory         General Comments: pt aware of vision deficits and reports "I know my brain isn't quite right. I"m off." Pt with noted decreased attn span and STM deficits compared to PLOF     General Comments   Began discussion on need to refrain from driving; pt with R homonymous hemianopsia. Recommend follow up with her eye doctor for a Full Field Visual Assessment    Exercises     Shoulder Instructions      Home Living Family/patient expects to be discharged to:: Private residence Living Arrangements: Spouse/significant other Available Help at Discharge: Family;Available 24 hours/day Type of Home: House Home Access: Stairs to enter [employer] of Steps: 1   Home Layout: Two level;Bed/bath upstairs Alternate Level Stairs-Number of Steps: flight Alternate Level Stairs-Rails: Right Bathroom Shower/Tub: Occupational psychologist: Handicapped height Bathroom Accessibility: Yes How  Accessible: Accessible via walker Home Equipment: None          Prior Functioning/Environment Prior Level of Function : Independent/Modified Independent;Working/employed;Driving             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, works at a church ADLs Comments: indep        OT Problem List: Impaired vision/perception;Decreased safety awareness;Decreased knowledge of precautions      OT Treatment/Interventions: Self-care/ADL training;DME and/or AE instruction;Cognitive remediation/compensation;Visual/perceptual remediation/compensation;Patient/family education    OT Goals(Current goals can be found in the care plan section) Acute Rehab OT Goals Patient Stated Goal: for her vision to improve OT Goal Formulation: With patient/family Time For Goal Achievement: 07/21/21 Potential to Achieve Goals: Good  OT Frequency: Min 2X/week    Co-evaluation              AM-PAC OT "6 Clicks" Daily Activity     Outcome Measure Help from another person eating meals?: None Help from another person taking care of personal grooming?: A Little Help from another person toileting, which includes using toliet, bedpan, or urinal?: A Little Help from another person bathing (including washing, rinsing, drying)?: A Little Help from another person to put on and taking off regular upper body clothing?: A Little Help from another person to put on and taking off regular lower body clothing?: A Little 6 Click Score: 19   End of Session Nurse Communication: Mobility status;Other (comment) (DC plan)  Activity  Tolerance: Patient tolerated treatment well Patient left: in chair;with call bell/phone within reach;with family/visitor present  OT Visit Diagnosis: Other abnormalities of gait and mobility (R26.89);Low vision, both eyes (H54.2)                Time: 6754-4920 OT Time Calculation (min): 20 min Charges:  OT General Charges $OT Visit: 1 Visit OT Evaluation $OT Eval Moderate Complexity: [place], OT/L   Acute OT Clinical Specialist Acute Rehabilitation Services Pager [phone] Office [phone]   [facility] 07/07/2021, 2:11 PM

## 2021-07-07 NOTE — Evaluation (Addendum)
Physical Therapy Evaluation Patient Details Name: Debbie Horton MRN: [id number] DOB: 1966/01/24 Today's Date: 07/07/2021  History of Present Illness  Pt is a 56yo female who presented to ED with vision changes, headache, and feeling "off". MRI revealed L occipital tumor. Pt underwent excision on 5/23. 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 PMH: unremarkable   Clinical Impression  Pt admitted with above. Pt presenting with R field vision loss. Pt aware. Noted pt not tracking to the R but can with cues. Pt aware she will need to learn compensatory strategies. Pt currently requiring minA for transfers and ambulation however today is pt's first time up and is adjusting to vision loss. Pt has excellent support at home but will need to navigate a flight of stairs to access bed/bath.  Suspect pt will progress well functionally and will not need follow up PT.      Recommendations for follow up therapy are one component of a multi-disciplinary discharge planning process, led by the attending physician.  Recommendations may be updated based on patient status, additional functional criteria and insurance authorization.  Follow Up Recommendations No PT follow up    Assistance Recommended at Discharge Frequent or constant Supervision/Assistance  Patient can return home with the following  A little help with walking and/or transfers;A little help with bathing/dressing/bathroom;Assist for transportation;Help with stairs or ramp for entrance;Assistance with cooking/housework    Equipment Recommendations Rolling walker, will assess again next PT visit  Recommendations for Other Services       Functional Status Assessment Patient has had a recent decline in their functional status and demonstrates the ability to make significant improvements in function in a reasonable and predictable amount of time.     Precautions / Restrictions Precautions Precautions: Fall      Mobility  Bed Mobility Overal bed mobility: Needs  Assistance Bed Mobility: Supine to Sit     Supine to sit: Min assist     General bed mobility comments: HOB elevated to aide in BP dropping as pt's first time up since surgery, minA for safety, guarding, slow to move but steady    Transfers Overall transfer level: Needs assistance Equipment used: 1 person hand held assist Transfers: Sit to/from Stand Sit to Stand: Min assist           General transfer comment: minA for safety due to vision impairment, pt powered up well, slow and guarded due to first time up    Ambulation/Gait Ambulation/Gait assistance: Min assist Gait Distance (Feet): 30 Feet Assistive device: IV Pole Gait Pattern/deviations: Step-through pattern, Decreased stride length Gait velocity: slow Gait velocity interpretation: <1.31 ft/sec, indicative of household ambulator   General Gait Details: slow, cautious, guarded, limited by vision and feeling loopy  Stairs            Wheelchair Mobility    Modified Rankin (Stroke Patients Only)       Balance Overall balance assessment: Needs assistance Sitting-balance support: Feet unsupported, Single extremity supported Sitting balance-Leahy Scale: Fair     Standing balance support: Single extremity supported, During functional activity Standing balance-Leahy Scale: Fair Standing balance comment: benefit from support due to impaired vision                             Pertinent Vitals/Pain Pain Assessment Pain Assessment: 0-10 Pain Score: 5  Pain Location: incision, L posterior head Pain Descriptors / Indicators: Headache Pain Intervention(s): RN gave pain meds during session    Home Living Family/patient  expects to be discharged to:: Private residence Living Arrangements: Spouse/significant other Available Help at Discharge: Family;Available 24 hours/day Type of Home: House Home Access: Stairs to enter   Entrance Stairs-Number of Steps: 1 Alternate Level Stairs-Number of  Steps: flight Home Layout: Two level;Bed/bath upstairs        Prior Function Prior Level of Function : Independent/Modified Independent;Working/employed             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, works at a church ADLs Comments: indep     Journalist, newspaper   Dominant Hand: Right    Extremity/Trunk Assessment   Upper Extremity Assessment Upper Extremity Assessment: Overall WFL for tasks assessed    Lower Extremity Assessment Lower Extremity Assessment: Overall WFL for tasks assessed    Cervical / Trunk Assessment Cervical / Trunk Assessment: Other exceptions Cervical / Trunk Exceptions: brain surgery  Communication   Communication: No difficulties  Cognition Arousal/Alertness: Awake/alert (sleepy as expected after surgery) Behavior During Therapy: WFL for tasks assessed/performed Overall Cognitive Status: impaired attn span and STM compared to PLOF                                 General Comments: pt aware of vision deficits and reports "I know my brain isn't quite right. I"m off."        General Comments General comments (skin integrity, edema, etc.): incision with dried drainage, Dr. Reatha Armour observed    Exercises     Assessment/Plan    PT Assessment Patient needs continued PT services  PT Problem List Decreased activity tolerance;Decreased balance;Decreased mobility;Decreased coordination       PT Treatment Interventions Gait training;Stair training;Functional mobility training;Therapeutic activities;Therapeutic exercise;Balance training;Neuromuscular re-education    PT Goals (Current goals can be found in the Care Plan section)  Acute Rehab PT Goals Patient Stated Goal: home asap PT Goal Formulation: With patient/family Time For Goal Achievement: 07/15/21 Potential to Achieve Goals: Good    Frequency Min 3X/week     Co-evaluation               AM-PAC PT "6 Clicks" Mobility  Outcome Measure Help needed turning from your back to  your side while in a flat bed without using bedrails?: A Little Help needed moving from lying on your back to sitting on the side of a flat bed without using bedrails?: A Little Help needed moving to and from a bed to a chair (including a wheelchair)?: A Little Help needed standing up from a chair using your arms (e.g., wheelchair or bedside chair)?: A Little Help needed to walk in hospital room?: A Little Help needed climbing 3-5 steps with a railing? : A Lot 6 Click Score: 17    End of Session Equipment Utilized During Treatment: Gait belt Activity Tolerance: Patient tolerated treatment well Patient left: in chair;with call bell/phone within reach;with nursing/sitter in room;with family/visitor present Nurse Communication: Mobility status PT Visit Diagnosis: Unsteadiness on feet (R26.81)    Time: 0730-0800 PT Time Calculation (min) (ACUTE ONLY): 30 min   Charges:   PT Evaluation $PT Eval Moderate Complexity: 1 Mod PT Treatments $Gait Training: 8-22 mins        Kittie Plater, PT, DPT Acute Rehabilitation Services Secure chat preferred Office #: [phone]   Berline Lopes 07/07/2021, 9:16 AM

## 2021-07-07 NOTE — Progress Notes (Addendum)
NP Reinaldo Meeker paged. Patient having gas pain, requested medication for gas pain. Orders placed by NP for simethicone oral tablet 80 mg every 6 hours PRN. Will continue to monitor.

## 2021-07-08 ENCOUNTER — Other Ambulatory Visit: Payer: Self-pay | Admitting: Radiation Therapy

## 2021-07-08 DIAGNOSIS — C719 Malignant neoplasm of brain, unspecified: Secondary | ICD-10-CM

## 2021-07-08 LAB — URINALYSIS, ROUTINE W REFLEX MICROSCOPIC
Bilirubin Urine: NEGATIVE
Glucose, UA: NEGATIVE mg/dL
Ketones, ur: NEGATIVE mg/dL
Nitrite: NEGATIVE
Protein, ur: NEGATIVE mg/dL
Specific Gravity, Urine: 1.017 (ref 1.005–1.030)
WBC, UA: >50 WBC/hpf — ABNORMAL HIGH (ref 0–5)
pH: 6 (ref 5.0–8.0)

## 2021-07-08 MED ORDER — DEXAMETHASONE 1 MG PO TABS: ORAL_TABLET | ORAL | 0 refills | Status: DC

## 2021-07-08 MED ORDER — LEVETIRACETAM 500 MG PO TABS: 500.0000 mg | ORAL_TABLET | Freq: Two times a day (BID) | ORAL | 0 refills | Status: DC

## 2021-07-08 MED ORDER — TRAMADOL HCL 50 MG PO TABS: 50.0000 mg | ORAL_TABLET | Freq: Two times a day (BID) | ORAL | 0 refills | Status: DC | PRN

## 2021-07-08 MED ORDER — NITROFURANTOIN MONOHYD MACRO 100 MG PO CAPS: 100.0000 mg | ORAL_CAPSULE | Freq: Two times a day (BID) | ORAL | 0 refills | Status: AC

## 2021-07-08 MED ORDER — ALPRAZOLAM 0.25 MG PO TABS: 0.2500 mg | ORAL_TABLET | Freq: Two times a day (BID) | ORAL | 0 refills | Status: DC | PRN

## 2021-07-08 NOTE — Discharge Summary (Signed)
Physician Discharge Summary  Patient ID: Debbie Horton MRN: [id number] DOB/AGE: 1965/05/31 56 y.o.  Admit date: 06/30/2021 Discharge date: 07/08/2021  Admission Diagnoses: Left occipital intra-axial tumor  Discharge Diagnoses: Left occipital intra-axial tumor Principal Problem:   Brain mass   Discharged Condition: good  Hospital Course: The patient was admitted on 07/01/2021 and taken to the operating room on 07/06/2021 where the patient underwent left occipital craniotomy for resection of intra-axial tumor. The patient tolerated the procedure well and was taken to the recovery room and then to the floor in stable condition. The hospital course was routine. There were no surgical complications. The wound remained clean dry and intact. Pt had no complaints of pain or soreness related to her surgery. She did have complaints of dysuria with UA reporting UTI. She was provided a 7 day course of Macrobid with PCP follow up. She had mild postsurgical progression of right hemianopsia. Otherwise her neurological exam remained stable with no complaints of new deficits. The patient remained afebrile with stable vital signs, and tolerated a regular diet. The patient continued to increase activities, and is now ready for discharge.   Consults:  [facility]  Significant Diagnostic Studies: radiology: MRI: brain  Treatments: surgery:  Left occipital craniotomy for resection of intra-axial tumor Intraoperative use of microscope for microdissection Intraoperative use of stereotaxy, Brain[facility]  Discharge Exam: Blood pressure 117/76, pulse 67, temperature 97.6 F (36.4 C), temperature source Oral, resp. rate 12, height '5\' 3"'$  (1.6 m), weight 59 kg, SpO2 95 %.  Physical Exam: Patient is awake, A/O X 4, conversant, and in good spirits. Eyes open spontaneously. They are in NAD and VSS. Doing well. Speech is fluent and appropriate. MAEW with good strength that is symmetric bilaterally.  BUE 5/5  throughout, BLE 5/5 throughout. Sensation to light touch is intact. Right hemianopsia. PERLA, EOMI. CNs grossly intact. Dressing is clean dry intact.  Disposition: Discharge disposition: 01-Home or Self Care        Allergies as of 07/08/2021       Reactions   Latex Rash   Penicillins Rash   Phenergan [promethazine] Palpitations   Sulfa Antibiotics Rash        Medication List     TAKE these medications    acetaminophen 500 MG tablet Commonly known as: TYLENOL Take 1,000 mg by mouth every 8 (eight) hours as needed for headache.   ALPRAZolam 0.25 MG tablet Commonly known as: Xanax Take 1 tablet (0.25 mg total) by mouth 2 (two) times daily as needed for anxiety.   dexamethasone 1 MG tablet Commonly known as: DECADRON Take 4 tablets (4 mg total) by mouth 2 (two) times daily with a meal for 2 days, THEN 3 tablets (3 mg total) 2 (two) times daily with a meal for 2 days, THEN 2 tablets (2 mg total) 2 (two) times daily with a meal for 2 days, THEN 2 tablets (2 mg total) daily with breakfast. Start taking on: Jul 08, 2021   estradiol 0.05 mg/24hr patch Commonly known as: CLIMARA - Dosed in mg/24 hr Place 0.05 mg onto the skin every Wednesday.   ibuprofen 200 MG tablet Commonly known as: ADVIL Take 400 mg by mouth every 8 (eight) hours as needed for headache.   levETIRAcetam 500 MG tablet Commonly known as: Keppra Take 1 tablet (500 mg total) by mouth 2 (two) times daily.   levocetirizine 5 MG tablet Commonly known as: XYZAL Take 5 mg by mouth daily as needed (seasonal allergies).   nitrofurantoin (  macrocrystal-monohydrate) 100 MG capsule Commonly known as: Macrobid Take 1 capsule (100 mg total) by mouth 2 (two) times daily for 7 days.   progesterone 200 MG capsule Commonly known as: PROMETRIUM Take 200 mg by mouth at bedtime.   traMADol 50 MG tablet Commonly known as: Ultram Take 1 tablet (50 mg total) by mouth every 12 (twelve) hours as needed.          Signed: Marvis Moeller, DNP, AGNP-C Neurosurgery Nurse Practitioner  [facility] [place] [address], [place], [place] [address] P: [phone]    F: [phone]  07/08/2021 1:32 PM

## 2021-07-08 NOTE — Progress Notes (Signed)
AVS paperwork reviewed with patient; confirmed understanding through teach-back. Educated on OT follow-up, new medications, and pain management. Taken down in wheelchair, assisted into car.

## 2021-07-08 NOTE — TOC Initial Note (Signed)
Transition of Care [facility]) - Initial/Assessment Note    Patient Details  Name: Debbie Horton MRN: [id number] Date of Birth: 1965/07/01  [facility] CM/SW Contact:    Marilu Favre, RN Phone Number: 07/08/2021, 11:29 AM  Clinical Narrative:                 Received secure chat from OT that recommendations will be updated to OP PT and OT . Discussed with patient , patient in agreement for OP therapy at [facility] and has transportation. Will need MD order.   Patient stated she ambulated today with no walker. Await orders and updated evaluation notes   Expected Discharge Plan: OP Rehab Barriers to Discharge: Continued Medical Work up   Patient Goals and CMS Choice Patient states their goals for this hospitalization and ongoing recovery are:: to return to home CMS Medicare.gov Compare Post Acute Care list provided to:: Patient Choice offered to / list presented to : Patient (OP PT /OT)  Expected Discharge Plan and Services Expected Discharge Plan: OP Rehab   Discharge Planning Services: CM Consult   Living arrangements for the past 2 months: Single Family Home                 DME Arranged: N/A                    Prior Living Arrangements/Services Living arrangements for the past 2 months: Single Family Home Lives with:: Spouse, Adult Children Patient language and need for interpreter reviewed:: Yes Do you feel safe going back to the place where you live?: Yes      Need for Family Participation in Patient Care: Yes (Comment) Care giver support system in place?: Yes (comment)   Criminal Activity/Legal Involvement Pertinent to Current Situation/Hospitalization: No - Comment as needed  Activities of Daily Living Home Assistive Devices/Equipment: None ADL Screening (condition at time of admission) Patient's cognitive ability adequate to safely complete daily activities?: Yes Is the patient deaf or have difficulty hearing?: No Does the patient  have difficulty seeing, even when wearing glasses/contacts?: No Does the patient have difficulty concentrating, remembering, or making decisions?: Yes Patient able to express need for assistance with ADLs?: Yes Does the patient have difficulty dressing or bathing?: No Independently performs ADLs?: Yes (appropriate for developmental age) Does the patient have difficulty walking or climbing stairs?: No Weakness of Legs: None Weakness of Arms/Hands: None  Permission Sought/Granted   Permission granted to share information with : Yes, Verbal Permission Granted  Share Information with NAME: sister           Emotional Assessment Appearance:: Appears stated age Attitude/Demeanor/Rapport: Engaged Affect (typically observed): Accepting Orientation: : Oriented to Self, Oriented to Place, Oriented to  Time, Oriented to Situation Alcohol / Substance Use: Not Applicable Psych Involvement: No (comment)  Admission diagnosis:  Brain mass [G93.89] Patient Active Problem List   Diagnosis Date Noted   Brain mass 06/30/2021   PCP:  Pcp, No Pharmacy:   [id number] [place] [place] [address]Phone: [phone]: [phone]    Social Determinants of Health (SDOH) Interventions    Readmission Risk Interventions     View : No data to display.

## 2021-07-08 NOTE — Progress Notes (Signed)
Physical Therapy Treatment Patient Details Name: Debbie Horton MRN: [id number] DOB: 10-13-1965 Today's Date: 07/08/2021   History of Present Illness Pt is a 56yo female who presented to ED with vision changes, headache, and feeling "off". MRI revealed L occipital tumor. Pt underwent excision on 5/23. 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 PMH: unremarkable    PT Comments    Patient progressing to hallway ambulation and able to negotiate steps with assist with rail safely to navigate home environment.  Sister present and educated pt needing assistance for stairs initially at d/c.  Patient also noted with slower pace, impaired balance and will benefit from follow up outpatient PT at d/c.    Recommendations for follow up therapy are one component of a multi-disciplinary discharge planning process, led by the attending physician.  Recommendations may be updated based on patient status, additional functional criteria and insurance authorization.  Follow Up Recommendations  Outpatient PT     Assistance Recommended at Discharge Frequent or constant Supervision/Assistance  Patient can return home with the following A little help with walking and/or transfers;A little help with bathing/dressing/bathroom;Assist for transportation;Help with stairs or ramp for entrance;Assistance with cooking/housework   Equipment Recommendations  None recommended by PT    Recommendations for Other Services       Precautions / Restrictions Precautions Precautions: Fall Precaution Comments: R field cut     Mobility  Bed Mobility         Supine to sit: Modified independent (Device/Increase time)          Transfers Overall transfer level: Needs assistance   Transfers: Sit to/from Stand Sit to Stand: Min guard           General transfer comment: for balance and lines    Ambulation/Gait Ambulation/Gait assistance: Min guard, Supervision Gait Distance (Feet): 300 Feet   Gait Pattern/deviations: Step-through  pattern, Decreased stride length       General Gait Details: slow pace, able to recognize obstacles from a distance on the right with cues, placed hand on door facing going around to R   Stairs Stairs: Yes Stairs assistance: Min guard Stair Management: One rail Right, Alternating pattern, Forwards Number of Stairs: 10 General stair comments: performed without hesitation once able to find the railing, takes time to locate items including door handle to exit the room and the rail   Wheelchair Mobility    Modified Rankin (Stroke Patients Only)       Balance Overall balance assessment: Needs assistance   Sitting balance-Leahy Scale: Good       Standing balance-Leahy Scale: Good Standing balance comment: ambulates without device or LOB, slower pace and reliant on touching items when navigating in tight spots and reports LE weakness from in bed several days                            Cognition Arousal/Alertness: Awake/alert Behavior During Therapy: WFL for tasks assessed/performed Overall Cognitive Status: Impaired/Different from baseline Area of Impairment: Memory, Attention                   Current Attention Level: Sustained Memory: Decreased short-term memory         General Comments: able to attend to conversation today and reports different partially due to being in bed for a week; seemed to follow recommendations for assistance and for follow up PT        Exercises      General Comments General comments (skin integrity,  edema, etc.): sister in the room and educated both on need for help on stairs at least first two days at home and for showering.  Also discussed follow up outpatient PT for balance and compensation      Pertinent Vitals/Pain Pain Assessment Pain Assessment: No/denies pain    Home Living                          Prior Function            PT Goals (current goals can now be found in the care plan section)  Progress towards PT goals: Progressing toward goals    Frequency    Min 3X/week      PT Plan Discharge plan needs to be updated    Co-evaluation              AM-PAC PT "6 Clicks" Mobility   Outcome Measure  Help needed turning from your back to your side while in a flat bed without using bedrails?: None Help needed moving from lying on your back to sitting on the side of a flat bed without using bedrails?: None Help needed moving to and from a bed to a chair (including a wheelchair)?: A Little Help needed standing up from a chair using your arms (e.g., wheelchair or bedside chair)?: A Little Help needed to walk in hospital room?: A Little Help needed climbing 3-5 steps with a railing? : A Little 6 Click Score: 20    End of Session Equipment Utilized During Treatment: Gait belt Activity Tolerance: Patient tolerated treatment well Patient left: in bed;with call bell/phone within reach;with family/visitor present;with nursing/sitter in room   PT Visit Diagnosis: Other abnormalities of gait and mobility (R26.89);Other symptoms and signs involving the nervous system (R29.898)     Time: 6812-7517 PT Time Calculation (min) (ACUTE ONLY): 24 min  Charges:  $Gait Training: 8-22 mins $Self Care/Home Management: [place], PT Acute Rehabilitation Services Pager:[phone] Office:[phone] 07/08/2021    Reginia Naas 07/08/2021, 11:41 AM

## 2021-07-09 ENCOUNTER — Telehealth: Payer: Self-pay | Admitting: Internal Medicine

## 2021-07-09 NOTE — Telephone Encounter (Signed)
Scheduled appt per 5/25 referral. Pt is aware of appt date and time. Pt is aware to arrive 15 mins prior to appt time and to bring and updated insurance card. Pt is aware of appt location.

## 2021-07-16 ENCOUNTER — Encounter (HOSPITAL_COMMUNITY): Payer: Self-pay

## 2021-07-19 ENCOUNTER — Inpatient Hospital Stay: Payer: 59 | Attending: Internal Medicine

## 2021-07-19 DIAGNOSIS — C714 Malignant neoplasm of occipital lobe: Secondary | ICD-10-CM | POA: Insufficient documentation

## 2021-07-21 ENCOUNTER — Encounter: Payer: Self-pay | Admitting: Occupational Therapy

## 2021-07-21 ENCOUNTER — Ambulatory Visit: Payer: 59 | Attending: Neurosurgery | Admitting: Occupational Therapy

## 2021-07-21 DIAGNOSIS — R41842 Visuospatial deficit: Secondary | ICD-10-CM | POA: Diagnosis present

## 2021-07-21 NOTE — Therapy (Unsigned)
OUTPATIENT OCCUPATIONAL THERAPY NEURO EVALUATION  Patient Name: Debbie Horton MRN: [id number] DOB:May 22, 1965, 56 y.o., female Today's Date: 07/21/2021  PCP: Not on file REFERRING PROVIDER: Marvis Moeller, NP    OT End of Session - 07/21/21 1106     Visit Number 1    Number of Visits 5    Date for OT Re-Evaluation 09/17/21    Authorization Type [employer]    Authorization Time Period VL: 60 (combined OT/PT/ST)    OT Start Time 1102    OT Stop Time 1152    OT Time Calculation (min) 50 min    Behavior During Therapy [facility] for tasks assessed/performed            History reviewed. No pertinent past medical history.  Past Surgical History:  Procedure Laterality Date   APPLICATION OF CRANIAL NAVIGATION N/A 07/06/2021   Procedure: APPLICATION OF CRANIAL NAVIGATION;  Surgeon: Dawley, Theodoro Doing, DO;  Location: [facility];  Service: Neurosurgery;  Laterality: N/A;   CRANIOTOMY N/A 07/06/2021   Procedure: LEFT OCCIPITAL CRANIOTOMY FOR TUMOR EXCISION;  Surgeon: Dawley, Theodoro Doing, DO;  Location: [facility];  Service: Neurosurgery;  Laterality: N/A;   Patient Active Problem List   Diagnosis Date Noted   Brain mass 06/30/2021    ONSET DATE: 07/08/2021 (date of OT order)  REFERRING DIAG: G93.89 (ICD-10-CM) - Brain mass   THERAPY DIAG:  Visuospatial deficit  Rationale for Evaluation and Treatment Rehabilitation  SUBJECTIVE:   SUBJECTIVE STATEMENT: Pt arrives for OP OT evaluation w/ primary concern of R-sided visual field cut and subsequent visual perceptual deficits 2/2 occipital tumor and subsequent resection. Pt reports she first noticed symptoms Mother's Day weekend; states she was experiencing headaches, new difficulty driving ("I kept veering off the road"), and her coworkers noticed differences in cognition. Ultimately presented to the ED on 06/30/21 and underwent surgery 07/06/21.  Pt accompanied by: self and significant other (Rod)  PERTINENT HISTORY: L occipital brain mass, consistent  w/ high grade glioma; craniotomy for tumor resection w/ Dr. Reatha Armour on 07/06/21  PRECAUTIONS: Other: no lifting greater than 10 lbs; no driving  WEIGHT BEARING RESTRICTIONS No  PAIN: Are you having pain? No  FALLS: Has patient fallen in last 6 months? No  LIVING ENVIRONMENT: Lives with: lives with their family (2 children); dog Lives in: House/apartment Stairs: Yes: Internal: 25 steps; on right going up and External: 1 steps; none Has following equipment at home: None  PLOF: Independent and Vocation/Vocational requirements: church in [place]: Return to PLOF; manage visual deficits  OBJECTIVE:   HAND DOMINANCE: Right  ADLs: Overall ADLs: Mod I-Ind - has identified some compensatory strategies to improve independence w/ activities (e.g., moving objects for grooming tasks to L side of sink) Transfers/ambulation related to ADLs: Difficulty w/ functional mobility due to R visual field cut Tub Shower transfers: Has tub/shower combo and walk-in shower Equipment: none  IADLs: Shopping: Currently relying on others Light housekeeping: Able to Swiffer, hand wash dishes; not currently participating in heavier tasks due to medical restrictions Meal Prep: Difficulty visually scanning for items for meal prep; relying on others, but able to complete simple snack prep w/ extended time Community mobility: Unable to drive at this time Medication management: Husband managing medication Handwriting: Increased time  MOBILITY STATUS:  Ambulated in/out of session tentatively but otherwise w/out assist; independently using UE as guard to prevent bumping into objects on R side  UPPER EXTREMITY ROM    WFL; Reports no concers  UPPER EXTREMITY  MMT: WFL; Reports no concerns  HAND FUNCTION: WFL; Reports no concerns  COORDINATION: Finger Nose Finger test: decreased speed; slight head rotation to R side for accuracy during testing w/ each UE  SENSATION: WFL; Reports no  concerns  COGNITION: Overall cognitive status: Within functional limits for tasks assessed  VISION: Subjective report: Reports vision goes "blurry" around the center of her vision and then cannot see past that point on her R side; difficulty w/ depth perception Baseline vision: Wears contacts and Wears glasses all the time Visual history:  None  VISION ASSESSMENT: Tracking/Visual pursuits: Able to track stimulus in all quads without difficulty Saccades: decreased speed of saccadic movements Convergence: Impaired: Difficulty keeping object in focus in near range  Visual Fields: Right homonymous hemianopsia and increased difficulty identifying object in superior field during screen  Patient has difficulty with following activities due to following visual impairments: Reading, navigating busy environments sup Symbol Cancellation: 10/10 w/ increased time and independent use of visual compensatory strategies Double Number Cancellation: 20/20 w/ increased time and independent use of compensatory strategies   TODAY'S TREATMENT:  N/A   PATIENT EDUCATION: Education provided on role and purpose of OT, as well as potential interventions and goals for therapy based on initial evaluation findings. Introduced visual compensatory strategies (e.g., turning head w/ visual scanning, using line guide/finger while reading, high contrast, etc) Person educated: Patient and Spouse Education method: Explanation, Demonstration, and Handouts Education comprehension: verbalized understanding and returned demonstration   HOME EXERCISE PROGRAM: N/A   GOALS: Goals reviewed with patient? Yes  SHORT TERM GOALS: Target date: 08/27/21  STG  Status:  1 Pt will be able to identify 10/10 targets w/ Mod I when ambulating around a busy environment, incorporating compensatory strategies prn Baseline: Reports difficulty navigating busy/unfamiliar environments Initial  2 Pt will complete simulated light IADL  activity at Mod I level within a reasonable amount of time, incorporating AE and/or visual compensatory strategies prn Baseline: Reports some difficulty w/ IADLs Initial  3 Pt will improve speed and accuracy during functional visual perceptual tasks as evidenced by finishing Trail Making Test: Part A in less than 45 seconds by d/c Baseline: Not tested at evaluation Initial    ASSESSMENT:  CLINICAL IMPRESSION: Pt is a 56 y/o female who presents to OP OT due to L occipital brain mass identified 06/30/21 after presenting to the ED for new onset of confusion, difficulty texting, and visual perceptual deficits. Pt underwent occipital craniotomy for tumor resection w/ Dr. Reatha Armour on 07/06/21 and has no other significant PMH noted or reported. Pt currently lives with her family in a multi-level house and was working full-time prior to onset. Pt presents w/ R homonymous hemianopsia and will benefit from a short course of skilled occupational therapy services to address visual perception, functional mobility (particularly in busy/unfamiliar environments), and introduction of potentially beneficial DME/AE prn to improve safety participation w/ all ADLs.   PERFORMANCE DEFICITS in functional skills including ADLs, IADLs, mobility, balance, decreased knowledge of use of DME, and vision and psychosocial skills including environmental adaptation.   IMPAIRMENTS are limiting patient from ADLs, IADLs, work, and leisure.   COMORBIDITIES has no other co-morbidities that affects occupational performance. Patient will benefit from skilled OT to address above impairments and improve overall function.  MODIFICATION OR ASSISTANCE TO COMPLETE EVALUATION: No modification of tasks or assist necessary to complete an evaluation.  OT OCCUPATIONAL PROFILE AND HISTORY: Problem focused assessment: Including review of records relating to presenting problem.  CLINICAL DECISION MAKING: LOW -  limited treatment options, no task  modification necessary  REHAB POTENTIAL: Good  EVALUATION COMPLEXITY: Low   PLAN: OT FREQUENCY: 1x/week  OT DURATION: 4 weeks  PLANNED INTERVENTIONS: self care/ADL training, therapeutic activity, functional mobility training, patient/family education, visual/perceptual remediation/compensation, and DME and/or AE instructions  RECOMMENDED OTHER SERVICES: None  CONSULTED AND AGREED WITH PLAN OF CARE: Patient and family member/caregiver  PLAN FOR NEXT SESSION: Review and introduce other relevant visual compensatory strategies; practice functional mobility and/or simulated IADL tasks w/ strategies   Kathrine Cords, MSOT, OTR/L  07/21/2021, 4:35 PM

## 2021-07-22 ENCOUNTER — Other Ambulatory Visit: Payer: Self-pay

## 2021-07-22 ENCOUNTER — Inpatient Hospital Stay (HOSPITAL_BASED_OUTPATIENT_CLINIC_OR_DEPARTMENT_OTHER): Payer: 59 | Admitting: Internal Medicine

## 2021-07-22 ENCOUNTER — Encounter: Payer: Self-pay | Admitting: Internal Medicine

## 2021-07-22 VITALS — BP 112/53 | HR 89 | Temp 97.8°F | Resp 16 | Ht 64.5 in | Wt 127.7 lb

## 2021-07-22 DIAGNOSIS — C714 Malignant neoplasm of occipital lobe: Secondary | ICD-10-CM

## 2021-07-22 DIAGNOSIS — C719 Malignant neoplasm of brain, unspecified: Secondary | ICD-10-CM

## 2021-07-22 NOTE — Progress Notes (Signed)
[facility] [place], [place] [address] [phone]   New Patient Evaluation  Date of Service: 07/22/21 Patient Name: Debbie Horton Patient MRN: [id number] Patient DOB: February 25, 1965 Provider: Ventura Sellers, MD  Identifying Statement:  [name] is a 56 y.o. female with left occipital glioblastoma who presents for initial consultation and evaluation.    Referring Provider: Karsten Ro, DO [address] [place],  [place] [address]  Oncologic History: Oncology History  Glioblastoma, IDH-wildtype [facility])  07/06/2021 Surgery   Left occipital craniotomy, resection with Dr. Reatha Armour; path is Glioblastoma, IDHwt     Biomarkers:  MGMT Unknown.  IDH 1/2 Wild type.  EGFR Unknown  TERT Unknown   History of Present Illness: The patient's records from the referring physician were obtained and reviewed and the patient interviewed to confirm this HPI.  [name] presented to medical attention last month with new onset headaches, blurry vision and episodes of confusion.  She obtained CNS imaging in the hospital which demonstrated an enhancing mass in the left occipital lobe.  She underwent craniotomy, resection with Dr. Reatha Armour; path demonstrated glioblastoma IDHwt.  Following surgery, she has tapered off steroids.  She feels close to her baseline, aside from right visual field impairment, not improved.  She is fully functional, independent, and otherwise healthy.  Medications: Current Outpatient Medications on File Prior to Visit  Medication Sig Dispense Refill   acetaminophen (TYLENOL) 500 MG tablet Take 1,000 mg by mouth every 8 (eight) hours as needed for headache.     ALPRAZolam (XANAX) 0.25 MG tablet Take 1 tablet (0.25 mg total) by mouth 2 (two) times daily as needed for anxiety. 30 tablet 0   dexamethasone (DECADRON) 1 MG tablet Take 4 tablets (4 mg total) by mouth 2 (two) times daily with a  meal for 2 days, THEN 3 tablets (3 mg total) 2 (two) times daily with a meal for 2 days, THEN 2 tablets (2 mg total) 2 (two) times daily with a meal for 2 days, THEN 2 tablets (2 mg total) daily with breakfast. 136 tablet 0   estradiol (CLIMARA - DOSED IN MG/24 HR) 0.05 mg/24hr patch Place 0.05 mg onto the skin every Wednesday.     ibuprofen (ADVIL) 200 MG tablet Take 400 mg by mouth every 8 (eight) hours as needed for headache.     levETIRAcetam (KEPPRA) 500 MG tablet Take 1 tablet (500 mg total) by mouth 2 (two) times daily. 14 tablet 0   levocetirizine (XYZAL) 5 MG tablet Take 5 mg by mouth daily as needed (seasonal allergies).     progesterone (PROMETRIUM) 200 MG capsule Take 200 mg by mouth at bedtime.     traMADol (ULTRAM) 50 MG tablet Take 1 tablet (50 mg total) by mouth every 12 (twelve) hours as needed. 15 tablet 0   No current facility-administered medications on file prior to visit.    Allergies:  Allergies  Allergen Reactions   Latex Rash   Penicillins Rash   Phenergan [Promethazine] Palpitations   Sulfa Antibiotics Rash   Past Medical History: No past medical history on file. Past Surgical History:  Past Surgical History:  Procedure Laterality Date   APPLICATION OF CRANIAL NAVIGATION N/A 07/06/2021   Procedure: APPLICATION OF CRANIAL NAVIGATION;  Surgeon: Dawley, Theodoro Doing, DO;  Location[facility];  Service: Neurosurgery;  Laterality: N/A;   CRANIOTOMY N/A 07/06/2021   Procedure: LEFT OCCIPITAL CRANIOTOMY FOR TUMOR EXCISION;  Surgeon: Karsten Ro, DO;  Location: [facility];  Service: Neurosurgery;  Laterality: N/A;   Social History:  Social History   Socioeconomic History   Marital status: Married    Spouse name: Not on file   Number of children: Not on file   Years of education: Not on file   Highest education level: Not on file  Occupational History   Not on file  Tobacco Use   Smoking status: Never   Smokeless tobacco: Never  Substance and Sexual Activity   Alcohol  use: Yes    Comment: occasional   Drug use: Never   Sexual activity: Not on file  Other Topics Concern   Not on file  Social History Narrative   Not on file   Social Determinants of Health   Financial Resource Strain: Not on file  Food Insecurity: Not on file  Transportation Needs: Not on file  Physical Activity: Not on file  Stress: Not on file  Social Connections: Not on file  Intimate Partner Violence: Not on file   Family History: No family history on file.  Review of Systems: Constitutional: Doesn't report fevers, chills or abnormal weight loss Eyes: Doesn't report blurriness of vision Ears, nose, mouth, throat, and face: Doesn't report sore throat Respiratory: Doesn't report cough, dyspnea or wheezes Cardiovascular: Doesn't report palpitation, chest discomfort  Gastrointestinal:  Doesn't report nausea, constipation, diarrhea GU: Doesn't report incontinence Skin: Doesn't report skin rashes Neurological: Per HPI Musculoskeletal: Doesn't report joint pain Behavioral/Psych: Doesn't report anxiety  Physical Exam: Vitals:   07/22/21 0850  BP: (!) 112/53  Pulse: 89  Resp: 16  Temp: 97.8 F (36.6 C)  SpO2: 100%   KPS: 80. General: Alert, cooperative, pleasant, in no acute distress Head: Normal EENT: No conjunctival injection or scleral icterus.  Lungs: Resp effort normal Cardiac: Regular rate Abdomen: Non-distended abdomen Skin: No rashes cyanosis or petechiae. Extremities: No clubbing or edema  Neurologic Exam: Mental Status: Awake, alert, attentive to examiner. Oriented to self and environment. Language is fluent with intact comprehension.  Cranial Nerves: Visual acuity is grossly normal. R hemianopia. Extra-ocular movements intact. No ptosis. Face is symmetric Motor: Tone and bulk are normal. Power is full in both arms and legs. Reflexes are symmetric, no pathologic reflexes present.  Sensory: Intact to light touch Gait: Normal.   Labs: I have reviewed  the data as listed    Component Value Date/Time   NA 137 07/07/2021 0529   K 3.8 07/07/2021 0529   CL 107 07/07/2021 0529   CO2 24 07/07/2021 0529   GLUCOSE 122 (H) 07/07/2021 0529   BUN 15 07/07/2021 0529   CREATININE 0.66 07/07/2021 0529   CALCIUM 8.1 (L) 07/07/2021 0529   GFRNONAA >60 07/07/2021 0529   Lab Results  Component Value Date   WBC 16.2 (H) 07/07/2021   NEUTROABS 5.8 06/30/2021   HGB 11.4 (L) 07/07/2021   HCT 33.6 (L) 07/07/2021   MCV 91.3 07/07/2021   PLT 184 07/07/2021    Imaging:  MR BRAIN W WO CONTRAST  Result Date: 07/07/2021 CLINICAL DATA:  Left occipital craniotomy for tumor excision, postop EXAM: MRI HEAD WITHOUT AND WITH CONTRAST TECHNIQUE: Multiplanar, multiecho pulse sequences of the brain and surrounding structures were obtained without and with intravenous contrast. CONTRAST:  30m GADAVIST GADOBUTROL 1 MMOL/ML IV SOLN COMPARISON:  Five days prior FINDINGS: Brain: Resected left occipital mass with expected blood products in the operative cavity. Small area of peripheral enhancement at the lower and medial aspect  measuring 6 mm on 18:23; other areas of postcontrast hyperintensity are attributed to intrinsic T1 shortening from methemoglobin. No complicating infarct or dural sinus thrombosis. Non worrisome fluid collection deep to the craniotomy flap. Vascular: Major flow voids and vascular enhancements are preserved Skull and upper cervical spine: Unremarkable craniotomy Sinuses/Orbits: Negative IMPRESSION: No complicating features of left occipital mass resection. Only subcentimeter enhancement is present along the lower and medial aspect of the cavity. Electronically Signed   By: Jorje Guild M.D.   On: 07/07/2021 05:00   CT CHEST ABDOMEN PELVIS W CONTRAST  Result Date: 07/02/2021 CLINICAL DATA:  Brain metastases, unknown primary. * Tracking Code: BO * EXAM: CT CHEST, ABDOMEN, AND PELVIS WITH CONTRAST TECHNIQUE: Multidetector CT imaging of the chest, abdomen  and pelvis was performed following the standard protocol during bolus administration of intravenous contrast. RADIATION DOSE REDUCTION: This exam was performed according to the departmental dose-optimization program which includes automated exposure control, adjustment of the mA and/or kV according to patient size and/or use of iterative reconstruction technique. CONTRAST:  158m OMNIPAQUE IOHEXOL 300 MG/ML  SOLN COMPARISON:  None Available. FINDINGS: CT CHEST FINDINGS Cardiovascular: Normal caliber thoracic aorta. No central pulmonary embolus on this nondedicated study. Normal size heart. No significant pericardial effusion/thickening. Mediastinum/Nodes: Hypodense 6 mm nodule in the right lobe of the thyroid. Not clinically significant; no follow-up imaging recommended (Ref: J Am Coll Radiol. 2015 Feb;12(2): 143-50).No pathologically enlarged mediastinal, hilar or axillary lymph nodes. Esophagus is grossly unremarkable. Lungs/Pleura: Mild biapical pleuroparenchymal scarring. No suspicious pulmonary nodules or masses. No focal airspace consolidation. No pleural effusion. No pneumothorax. Musculoskeletal: No aggressive lytic or blastic lesion of bone. No suspicious chest wall mass. CT ABDOMEN PELVIS FINDINGS Hepatobiliary: Benign hypodense 8 mm cyst on image 55/3. Suspicious hepatic lesion. Gallbladder is unremarkable. No biliary ductal dilation. Pancreas: No pancreatic ductal dilation or evidence of acute inflammation. Spleen: No splenomegaly or focal splenic lesion. Adrenals/Urinary Tract: Bilateral adrenal glands appear normal. No hydronephrosis. Kidneys demonstrate symmetric enhancement and excretion of contrast material. No suspicious renal mass. Urinary bladder is unremarkable for degree of distension. Stomach/Bowel: Radiopaque enteric contrast material traverses the rectum. Stomach is unremarkable for degree of distension. No pathologic dilation of small or large bowel. The appendix and terminal ileum appear  normal. No evidence of acute bowel inflammation. Vascular/Lymphatic: Early filling of a dilated left gonadal vein with dilated pelvic collateral vessels. Normal caliber abdominal aorta. Smooth IVC contours. No pathologically enlarged abdominal or pelvic lymph nodes. Reproductive: Uterus and bilateral adnexa are unremarkable. Other: No significant abdominopelvic free fluid. Musculoskeletal: No acute or significant osseous findings. IMPRESSION: 1. No evidence of primary malignancy or metastatic disease within the chest, abdomen, or pelvis. 2. Early filling of a dilated left gonadal vein with dilated pelvic collateral vessels, which can be seen with pelvic congestion syndrome. Electronically Signed   By: JDahlia BailiffM.D.   On: 07/02/2021 16:44   MR BRAIN W WO CONTRAST  Result Date: 07/02/2021 CLINICAL DATA:  57year old female presenting with neurologic deficit 2 days ago. Left hemisphere edema, enhancing 4 cm mass centered in the left occipital lobe with MRI characteristics favor in high-grade glioma. Preoperative surgical planning. EXAM: MRI HEAD WITHOUT AND WITH CONTRAST TECHNIQUE: Multiplanar, multiecho pulse sequences of the brain and surrounding structures were obtained without and with intravenous contrast. CONTRAST:  638mGADAVIST GADOBUTROL 1 MMOL/ML IV SOLN COMPARISON:  Brain MRI without and with contrast 07/01/2021. FINDINGS: Brain: Unchanged heterogeneously enhancing, centrally cystic or necrotic mass centered in the left occipital  lobe with areas of nonenhancing hypercellularity suggested on DWI. Heterogeneous calcification or hemosiderin primarily along the caudal aspect of the mass on SWI. Regional vasogenic versus tumoral edema. Enhancing component encompasses 42 x 35 x 34 mm (AP by transverse by CC). Stable mass effect on the left occipital horn and atrium of the left lateral ventricle. Stable rightward midline shift of 5 mm. No second lesion. No dural thickening. Basilar cisterns remain patent.  No superimposed restricted diffusion suggestive of acute infarction. No ventriculomegaly, extra-axial collection or acute intracranial hemorrhage. Cervicomedullary junction and pituitary are within normal limits. Vascular: Major intracranial vascular flow voids are stable. The major dural venous sinuses are enhancing and appear to be patent. Skull and upper cervical spine: Negative visible cervical spine and spinal cord. Visualized bone marrow signal is within normal limits. Sinuses/Orbits: Stable, negative. Other: Visible internal auditory structures appear normal. IMPRESSION: 1. Study for stereotactic surgical planning. 2. Unchanged left occipital lobe partially cystic/necrotic mass with heterogeneous hyperenhancement and hypercellularity most compatible with High-grade Glioma. Stable tumoral edema and intracranial mass effect with 5 mm of rightward midline shift. 3. No new intracranial abnormality. Electronically Signed   By: Genevie Ann M.D.   On: 07/02/2021 05:34   MR Brain W and Wo Contrast  Result Date: 07/01/2021 CLINICAL DATA:  Intracranial neoplasm EXAM: MRI HEAD WITHOUT AND WITH CONTRAST TECHNIQUE: Multiplanar, multiecho pulse sequences of the brain and surrounding structures were obtained without and with intravenous contrast. CONTRAST:  26m GADAVIST GADOBUTROL 1 MMOL/ML IV SOLN COMPARISON:  Head CT 06/30/2021 FINDINGS: Brain: Mass within the posterior left hemisphere measures 3.8 x 2.6 cm. Most of the central component is necrotic/cystic. There is mildly reduced diffusivity within the solid component. There is petechial hemorrhage within the lesion. There is a large amount of surrounding edema with rightward midline shift of 3 mm. The abnormal T2-weighted signal extends to the splenium of the corpus callosum. There are no other contrast-enhancing lesions.  No hydrocephalus. Vascular: Normal flow voids. Skull and upper cervical spine: Normal marrow signal. Sinuses/Orbits: Negative. Other: None.  IMPRESSION: Mass within the posterior left hemisphere with large amount of surrounding edema and 3 mm of rightward midline shift. 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 Electronically Signed   By: KUlyses JarredM.D.   On: 07/01/2021 02:25   CT Head Wo Contrast  Result Date: 06/30/2021 CLINICAL DATA:  Neurologic deficit. EXAM: CT HEAD WITHOUT CONTRAST TECHNIQUE: Contiguous axial images were obtained from the base of the skull through the vertex without intravenous contrast. RADIATION DOSE REDUCTION: This exam was performed according to the departmental dose-optimization program which includes automated exposure control, adjustment of the mA and/or kV according to patient size and/or use of iterative reconstruction technique. COMPARISON:  None Available. FINDINGS: Brain: There is a large area of vasogenic edema involving the left posterior frontal, parietal, and occipital lobes. And ill-defined low attenuating rounded lesion in the medial left occipital lobe (21/2) suspicious for a mass. Further characterization with MRI without and with contrast is recommended. There is associated mild mass effect and compression of the left lateral ventricle and adjacent sulci. There is approximately 3 mm left right midline shift. There is no acute intracranial hemorrhage. Vascular: No hyperdense vessel or unexpected calcification. Skull: Normal. Negative for fracture or focal lesion. Sinuses/Orbits: No acute finding. Other: None IMPRESSION: 1. No acute intracranial hemorrhage. 2. Large left vasogenic edema with associated mass effect and 3 mm left-to-right midline shift and findings concerning for underlying neoplasm. Further characterization with MRI  without and with contrast is recommended. These results were called by telephone at the time of interpretation on 06/30/2021 at 8:27 pm to provider [facility] , who verbally acknowledged these results. Electronically Signed   By: Anner Crete M.D.   On: 06/30/2021  20:28    Pathology: SURGICAL PATHOLOGY  CASE: [phone]  PATIENT: [place]  Surgical Pathology Report   Clinical History: brain tumor (cm)   FINAL MICROSCOPIC DIAGNOSIS:   A. BRAIN TUMOR, LEFT OCCIPITAL, EXCISION:  - High-grade glioma consistent with glioblastoma.  - See comment.   COMMENT:  The tumor shows marked atypia, high mitotic rate, endothelial  proliferation and necrosis consistent with glioblastoma.  Molecular  studies will be performed and reported as an addendum.   Dr. Alric Seton reviewed this case and agrees.    Assessment/Plan Glioblastoma, IDH-wildtype ([place])  We appreciate the opportunity to participate in the care of [employer].   We had an extensive conversation with her and her husband regarding pathology, prognosis, and available treatment pathways.  We are encouraged by her good quality resection, good functional status and lack of comorbidity.  We ultimately recommended proceeding with course of intensity modulated radiation therapy and concurrent daily Temozolomide.  Radiation will be administered Mon-Fri over 6 weeks, Temodar will be dosed at 23m/m2 to be given daily over 42 days.  We reviewed side effects of temodar, including fatigue, nausea/vomiting, constipation, and cytopenias.  Informed consent was verbally obtained at bedside to proceed with oral chemotherapy.  Chemotherapy should be held for the following:  ANC less than 1,000  Platelets less than 100,000  LFT or creatinine greater than 2x ULN  If clinical concerns/contraindications develop  Every 2 weeks during radiation, labs will be checked accompanied by a clinical evaluation in the brain tumor clinic.  Screening for potential clinical trials was performed and discussed using eligibility criteria for active protocols [facility], as well as national database available on [email or web address]    The patient is a candidate and is  interested in pursuing a research protocol.  [facility]  We also discussed and patient consented for additional tumor profiling and sequencing through [facility]  Advanced tumor profiling could help identify actionable mutation for targeted therapy and lead to direct clinical benefit.     We spent twenty additional minutes teaching regarding the natural history, biology, and historical experience in the treatment of brain tumors. We then discussed in detail the current recommendations for therapy focusing on the mode of administration, mechanism of action, anticipated toxicities, and quality of life issues associated with this plan. We also provided teaching sheets for the patient to take home as an additional resource.  She will let u[place]ore[place]now if she wants to [facility]  All questions were answered. The patient knows to call the clinic with any problems, questions or concerns. No barriers to learning were detected.  The total time spent in the encounter was 60 minutes and more than 50% was on counseling and review of test results   ZVentura Sellers MD Medical Director of Neuro-Oncology [facility]Whitney06/08/23 8:53 AM

## 2021-07-27 ENCOUNTER — Ambulatory Visit: Payer: 59 | Admitting: Occupational Therapy

## 2021-07-27 ENCOUNTER — Encounter: Payer: Self-pay | Admitting: Internal Medicine

## 2021-07-27 DIAGNOSIS — R41842 Visuospatial deficit: Secondary | ICD-10-CM

## 2021-07-27 NOTE — Therapy (Signed)
OUTPATIENT OCCUPATIONAL THERAPY Treatment Session  Patient Name: Debbie Horton MRN: [id number] DOB:1965-07-10, 56 y.o., female Today's Date: 07/27/2021  PCP: Not on file REFERRING PROVIDER: Marvis Moeller, NP     No past medical history on file.  Past Surgical History:  Procedure Laterality Date   APPLICATION OF CRANIAL NAVIGATION N/A 07/06/2021   Procedure: APPLICATION OF CRANIAL NAVIGATION;  Surgeon: Dawley, Theodoro Doing, DO;  Location: [place];  Service: Neurosurgery;  Laterality: N/A;   CRANIOTOMY N/A 07/06/2021   Procedure: LEFT OCCIPITAL CRANIOTOMY FOR TUMOR EXCISION;  Surgeon: Dawley, Theodoro Doing, DO;  Location: [place];  Service: Neurosurgery;  Laterality: N/A;   Patient Active Problem List   Diagnosis Date Noted   Glioblastoma, IDH-wildtype ([place]) 06/30/2021    ONSET DATE: 07/08/2021 (date of OT order)  REFERRING DIAG: G93.89 (ICD-10-CM) - Brain mass   THERAPY DIAG:  No diagnosis found.  Rationale for Evaluation and Treatment Rehabilitation  SUBJECTIVE:   SUBJECTIVE STATEMENT: Pt reports running in to a low hanging branch when walking through the park last night.  Pt accompanied by: self and significant other (Rod)  PERTINENT HISTORY: L occipital brain mass, consistent w/ high grade glioma; craniotomy for tumor resection w/ Dr. Reatha Armour on 07/06/21  PRECAUTIONS: Other: no lifting greater than 10 lbs; no driving  WEIGHT BEARING RESTRICTIONS No  PAIN: Are you having pain? No   PATIENT GOALS: Return to PLOF; manage visual deficits  OBJECTIVE:    TODAY'S TREATMENT:  Trail Making Test Part A: pt lifting pen x1 during visual scanning to complete Trail making A task.  Pt demonstrating increased head turn both R and L as well as rotation of paper to allow for increased visual perception of R side of paper. Visual perception tasks: Engaged in pen and paper tasks with visual scanning.  Pt demonstrating improved ability to maintain paper at midline and use head turns  and linear scanning to complete all without omission.  Engaged in visual scanning to locate correct letter combinations and same picture of various sizes for form constancy.  Provided pt with recommendations for various pen and paper as well as other table top tasks to engage in to address visual perception. Pattern replication with small pegs with focus on visual scanning to R to locate correct colored pegs.  Pt demonstrating good attention to task with good organization and scanning.  Pt utilizing external support with use of finger placement to maintain positioning.  Pt having to turn head to R and downward rotation to compensate for decreased visual field in R when picking up pegs on R.  Pt reports most difficulty in ensuring that she was selecting correct color peg in R visual field.   PATIENT EDUCATION: Education provided paper and pen and other table top tasks for visual scanning.  Demonstrated and reiterated use of high contrast and line guide with reading and scanning. Person educated: Patient Education method: Explanation, Demonstration, and Handouts Education comprehension: verbalized understanding and returned demonstration   HOME EXERCISE PROGRAM: N/A   GOALS: Goals reviewed with patient? Yes  SHORT TERM GOALS: Target date: 08/27/21  STG  Status:  1 Pt will be able to identify 10/10 targets w/ Mod I when ambulating around a busy environment, incorporating compensatory strategies prn Baseline: Reports difficulty navigating busy/unfamiliar environments Progressing  2 Pt will complete simulated light IADL activity at Mod I level within a reasonable amount of time, incorporating AE and/or visual compensatory strategies prn Baseline: Reports some difficulty w/ IADLs Progressing  3 Pt  will improve speed and accuracy during functional visual perceptual tasks as evidenced by finishing Trail Making Test: Part A in less than 45 seconds by d/c Baseline: 58.93 on 07/27/21 Progressing     ASSESSMENT:  CLINICAL IMPRESSION: Session with focus on visual scanning with pen and paper tasks and other table top tasks.  Pt demonstrating head turn R and L as well as turning of paper to compensate for R visual field cut.  Pt receptive to education on high contrast, blocking, line guide, as well as head turns during functional mobility.  Pt asking questions about prognosis and time line, therapist educating on rehabilitation as well as education on compensatory techniques.  PERFORMANCE DEFICITS in functional skills including ADLs, IADLs, mobility, balance, decreased knowledge of use of DME, and vision and psychosocial skills including environmental adaptation.   IMPAIRMENTS are limiting patient from ADLs, IADLs, work, and leisure.   COMORBIDITIES has no other co-morbidities that affects occupational performance. Patient will benefit from skilled OT to address above impairments and improve overall function.  MODIFICATION OR ASSISTANCE TO COMPLETE EVALUATION: No modification of tasks or assist necessary to complete an evaluation.  OT OCCUPATIONAL PROFILE AND HISTORY: Problem focused assessment: Including review of records relating to presenting problem.  CLINICAL DECISION MAKING: LOW - limited treatment options, no task modification necessary  REHAB POTENTIAL: Good  EVALUATION COMPLEXITY: Low   PLAN: OT FREQUENCY: 1x/week  OT DURATION: 4 weeks  PLANNED INTERVENTIONS: self care/ADL training, therapeutic activity, functional mobility training, patient/family education, visual/perceptual remediation/compensation, and DME and/or AE instructions  RECOMMENDED OTHER SERVICES: None  CONSULTED AND AGREED WITH PLAN OF CARE: Patient and family member/caregiver  PLAN FOR NEXT SESSION: Review and introduce other relevant visual compensatory strategies; practice functional mobility and/or simulated IADL tasks w/ strategies   Simonne Come, OTR/L  07/27/2021, 7:52 AM

## 2021-08-03 ENCOUNTER — Encounter: Payer: 59 | Admitting: Occupational Therapy

## 2021-08-05 ENCOUNTER — Ambulatory Visit: Payer: 59 | Admitting: Occupational Therapy

## 2021-08-05 ENCOUNTER — Encounter: Payer: Self-pay | Admitting: Occupational Therapy

## 2021-08-05 DIAGNOSIS — R41842 Visuospatial deficit: Secondary | ICD-10-CM

## 2021-08-05 NOTE — Therapy (Signed)
OUTPATIENT OCCUPATIONAL THERAPY Treatment Session  Patient Name: Debbie Horton MRN: [id number] DOB:Feb 05, 1966, 56 y.o., female Today's Date: 08/05/2021  PCP: Not on file REFERRING PROVIDER: Marvis Moeller, NP    OT End of Session - 08/05/21 1231     Visit Number 3    Number of Visits 5    Date for OT Re-Evaluation 09/17/21    Authorization Type [employer]    Authorization Time Period VL: 60 (combined OT/PT/ST)    OT Start Time 3762    OT Stop Time 1015    OT Time Calculation (min) 42 min    Activity Tolerance Patient tolerated treatment well    Behavior During Therapy [facility] for tasks assessed/performed             History reviewed. No pertinent past medical history. No pertinent past medical history.  Past Surgical History:  Procedure Laterality Date   APPLICATION OF CRANIAL NAVIGATION N/A 07/06/2021   Procedure: APPLICATION OF CRANIAL NAVIGATION;  Surgeon: Dawley, Theodoro Doing, DO;  Location: [place];  Service: Neurosurgery;  Laterality: N/A;   CRANIOTOMY N/A 07/06/2021   Procedure: LEFT OCCIPITAL CRANIOTOMY FOR TUMOR EXCISION;  Surgeon: Dawley, Theodoro Doing, DO;  Location: [place];  Service: Neurosurgery;  Laterality: N/A;   Patient Active Problem List   Diagnosis Date Noted   Glioblastoma, IDH-wildtype ([place]) 06/30/2021    ONSET DATE: 07/08/2021 (date of OT order)  REFERRING DIAG: G93.89 (ICD-10-CM) - Brain mass   THERAPY DIAG:  Visuospatial deficit  Rationale for Evaluation and Treatment Rehabilitation  SUBJECTIVE:   SUBJECTIVE STATEMENT:  Pt reports Mod I with HEP and denies pain. Pt states that she is scheduled to begin radiation next week, followed by chemotherapy pill at home.   Pt accompanied by: self and significant other (Rod in waiting room)  PERTINENT HISTORY: L occipital brain mass, consistent w/ high grade glioma; craniotomy for tumor resection w/ Dr. Reatha Armour on 07/06/21  PRECAUTIONS: Other: no lifting greater than 10 lbs; no driving  WEIGHT BEARING  RESTRICTIONS No  PAIN: Are you having pain? No   PATIENT GOALS: Return to PLOF; manage visual deficits  OBJECTIVE:    TODAY'S TREATMENT:  Pt reports that she is using a visual tracker tool on her computer and has been to work a couple of times. She benefits form using visual tracking strategies when reading and writing. Discussed importance of taking rest breaks PRN and reading in short bouts as this appears to become more difficult with time per observation in clinic and is confirmed by pt reports.   Visual perception tasks: Engaged in reading tasks with focus on visual scanning. Pt benefits from use of a colored guide paper and reading one line at a time. Pt demonstrates improved ability to maintain paper at midline and use head turns and linear scanning to complete all without omission.  Engaged in visual scanning to read 2-3 sentences and focus on comprehension. Pt with difficulty summarizing sentences, but was noted to improve over time given min vc's.  Discussed recommendations for reading as well as other table top tasks to engage in to address visual perception. Visual scanning during functional mobility in moderately busy environment. Pt demonstrating good attention to task with good organization and scanning.  She was noted to utilize external support with use of finger placement to maintain positioning when walking through gym and scan for playing cards that were hung on the walls in various planes.  Pt having to turn head to R and downward rotation to  compensate for decreased visual field in R when searching for cards on her R.  Pt was noted to miss 6 out of 20 cards on initial attempt and only 2 out of 20 her second attempt (following discussion on strategies to use in a busier environment). Discussed strategies that she can also use at home vs work and when shopping (going to [employer]) with family. Pt should benefit from short bouts with rest breaks PRN to avoid overstimulation.     PATIENT EDUCATION: Education provided paper and pen and other table top tasks for visual scanning.  Demonstrated and reiterated use of high contrast and line guide with reading and scanning. Person educated: Patient Education method: Explanation, Demonstration, and Handouts Education comprehension: verbalized understanding and returned demonstration   HOME EXERCISE PROGRAM: N/A   GOALS: Goals reviewed with patient? Yes  SHORT TERM GOALS: Target date: 08/27/21  STG  Status:  1 Pt will be able to identify 10/10 targets w/ Mod I when ambulating around a busy environment, incorporating compensatory strategies prn Baseline: Reports difficulty navigating busy/unfamiliar environments Progressing  2 Pt will complete simulated light IADL activity at Mod I level within a reasonable amount of time, incorporating AE and/or visual compensatory strategies prn Baseline: Reports some difficulty w/ IADLs Progressing  3 Pt will improve speed and accuracy during functional visual perceptual tasks as evidenced by finishing Trail Making Test: Part A in less than 45 seconds by d/c Baseline: 58.93 on 07/27/21 Progressing    ASSESSMENT:  CLINICAL IMPRESSION:  OT session with focus on visual scanning during table top tasks, reading and when ambulating in a moderately busy environment. Pt demonstrating good strategies by turning her head turn R and L as well as use of paper & her finger as a guide to compensate for R visual field cut.  Pt remains receptive to education and is motivated for therapy. She will benefit from continued out-pt OT with focus on  rehabilitation as well as education [id number] techniques.  PERFORMANCE DEFICITS in functional skills including ADLs, IADLs, mobility, balance, decreased knowledge of use of DME, and vision and psychosocial skills including environmental adaptation.   IMPAIRMENTS are limiting patient from ADLs, IADLs, work, and leisure.   COMORBIDITIES has no other  co-morbidities that affects occupational performance. Patient will benefit from skilled OT to address above impairments and improve overall function.  MODIFICATION OR ASSISTANCE TO COMPLETE EVALUATION: No modification of tasks or assist necessary to complete an evaluation.  OT OCCUPATIONAL PROFILE AND HISTORY: Problem focused assessment: Including review of records relating to presenting problem.  CLINICAL DECISION MAKING: LOW - limited treatment options, no task modification necessary  REHAB POTENTIAL: Good  EVALUATION COMPLEXITY: Low   PLAN: OT FREQUENCY: 1x/week  OT DURATION: 4 weeks  PLANNED INTERVENTIONS: self care/ADL training, therapeutic activity, functional mobility training, patient/family education, visual/perceptual remediation/compensation, and DME and/or AE instructions  RECOMMENDED OTHER SERVICES: None  CONSULTED AND AGREED WITH PLAN OF CARE: Patient and family member/caregiver  PLAN FOR NEXT SESSION:   Review and introduce other relevant visual compensatory strategies; practice functional mobility and/or simulated IADL tasks w/ strategies. Assess pt needs and adjust schedule PRN as she begins radiation/chemotherapy.   Cherrie Franca Carlynn Herald, OTR/L  08/05/2021, 12:32 PM

## 2021-08-11 ENCOUNTER — Ambulatory Visit: Payer: 59 | Admitting: Occupational Therapy

## 2021-10-19 ENCOUNTER — Encounter: Payer: Self-pay | Admitting: Occupational Therapy

## 2021-10-19 NOTE — Therapy (Signed)
[facility] [address], [place], [place], [address] Phone: [phone]   Fax:  [phone]  Patient Details  Name: Debbie Horton MRN: [id number] Date of Birth: 1965/12/15 Referring Provider:  No ref. provider found  Encounter Date: 10/19/2021  OCCUPATIONAL THERAPY DISCHARGE SUMMARY  Visits from Start of Care: 3  Current functional level related to goals / functional outcomes: Per previous OT treatment note, pt reports Mod I with HEP and utilizing adaptive techniques for vision compensation when reading and writing.  Pt    Remaining deficits: Unable to assess as pt did not return after last treatment session, however demonstrating continued impaired vision (missing 6 of 20 cards initially, improving to 2 of 20 after education on strategies) at last session.   Education / Equipment: Educated on on AE and strategies for use at seated position and in busier environment to compensate for vision field impairment.   Patient agrees to discharge. Patient goals were not met. Patient is being discharged due to not returning since the last visit.Marland Kitchen     Simonne Come, OT 10/19/2021, 9:05 AM  [facility] [address], [place], [place], [address] Phone: [phone]   Fax:  [phone]

## 2021-11-04 ENCOUNTER — Ambulatory Visit
Admission: RE | Admit: 2021-11-04 | Discharge: 2021-11-04 | Disposition: A | Discharge status: Home / Self Care | Payer: Self-pay | Source: Ambulatory Visit | Attending: Internal Medicine | Admitting: Internal Medicine

## 2021-11-04 ENCOUNTER — Other Ambulatory Visit: Payer: Self-pay | Admitting: *Deleted

## 2021-11-04 DIAGNOSIS — C719 Malignant neoplasm of brain, unspecified: Secondary | ICD-10-CM

## 2021-11-04 NOTE — Progress Notes (Signed)
Requested MRI from [facility] for MRI brain 08/09/2021 and 10/20/2021 to be pushed out to [place].

## 2021-11-22 ENCOUNTER — Other Ambulatory Visit: Payer: Self-pay | Admitting: *Deleted

## 2021-11-22 ENCOUNTER — Inpatient Hospital Stay: Payer: 59 | Attending: Internal Medicine | Admitting: Internal Medicine

## 2021-11-22 ENCOUNTER — Inpatient Hospital Stay: Payer: 59

## 2021-11-22 ENCOUNTER — Other Ambulatory Visit: Payer: Self-pay

## 2021-11-22 VITALS — BP 118/73 | HR 78 | Temp 98.3°F | Resp 16 | Ht 64.5 in | Wt 128.0 lb

## 2021-11-22 DIAGNOSIS — C714 Malignant neoplasm of occipital lobe: Secondary | ICD-10-CM | POA: Insufficient documentation

## 2021-11-22 DIAGNOSIS — C719 Malignant neoplasm of brain, unspecified: Secondary | ICD-10-CM

## 2021-11-22 LAB — CBC WITH DIFFERENTIAL (CANCER CENTER ONLY)
Abs Immature Granulocytes: 0.01 10*3/uL (ref 0.00–0.07)
Basophils Absolute: 0.1 10*3/uL (ref 0.0–0.1)
Basophils Relative: 1 %
Eosinophils Absolute: 0.1 10*3/uL (ref 0.0–0.5)
Eosinophils Relative: 3 %
HCT: 38.9 % (ref 36.0–46.0)
Hemoglobin: 13.5 g/dL (ref 12.0–15.0)
Immature Granulocytes: 0 %
Lymphocytes Relative: 18 %
Lymphs Abs: 0.7 10*3/uL (ref 0.7–4.0)
MCH: 31.4 pg (ref 26.0–34.0)
MCHC: 34.7 g/dL (ref 30.0–36.0)
MCV: 90.5 fL (ref 80.0–100.0)
Monocytes Absolute: 0.4 10*3/uL (ref 0.1–1.0)
Monocytes Relative: 11 %
Neutro Abs: 2.4 10*3/uL (ref 1.7–7.7)
Neutrophils Relative %: 67 %
Platelet Count: 136 10*3/uL — ABNORMAL LOW (ref 150–400)
RBC: 4.3 MIL/uL (ref 3.87–5.11)
RDW: 11.8 % (ref 11.5–15.5)
WBC Count: 3.6 10*3/uL — ABNORMAL LOW (ref 4.0–10.5)
nRBC: 0 % (ref 0.0–0.2)

## 2021-11-22 LAB — CMP (CANCER CENTER ONLY)
ALT: 10 U/L (ref 0–44)
AST: 17 U/L (ref 15–41)
Albumin: 4.4 g/dL (ref 3.5–5.0)
Alkaline Phosphatase: 48 U/L (ref 38–126)
Anion gap: 5 (ref 5–15)
BUN: 19 mg/dL (ref 6–20)
CO2: 30 mmol/L (ref 22–32)
Calcium: 9.1 mg/dL (ref 8.9–10.3)
Chloride: 104 mmol/L (ref 98–111)
Creatinine: 0.85 mg/dL (ref 0.44–1.00)
GFR, Estimated: >60 mL/min (ref >60)
Glucose, Bld: 86 mg/dL (ref 70–99)
Potassium: 3.7 mmol/L (ref 3.5–5.1)
Sodium: 139 mmol/L (ref 135–145)
Total Bilirubin: 0.8 mg/dL (ref 0.3–1.2)
Total Protein: 7 g/dL (ref 6.5–8.1)

## 2021-11-22 MED ORDER — TEMOZOLOMIDE 180 MG PO CAPS: 180.0000 mg | ORAL_CAPSULE | Freq: Every day | ORAL | 0 refills | Status: DC

## 2021-11-22 MED ORDER — ONDANSETRON HCL 8 MG PO TABS: 8.0000 mg | ORAL_TABLET | Freq: Three times a day (TID) | ORAL | 1 refills | Status: DC | PRN

## 2021-11-22 MED ORDER — TEMOZOLOMIDE 140 MG PO CAPS: 140.0000 mg | ORAL_CAPSULE | Freq: Every day | ORAL | 0 refills | Status: DC

## 2021-11-22 NOTE — Progress Notes (Signed)
START ON PATHWAY REGIMEN - Neuro     A cycle is every 28 days:     Temozolomide      Temozolomide   **Always confirm dose/schedule in your pharmacy ordering system**  Patient Characteristics: Glioma, Glioblastoma, IDH-wildtype, Newly Diagnosed / Treatment Naive, Good Performance Status and/or Younger Patient, MGMT Promoter Unmethylated/Unknown Disease Classification: Glioma Disease Classification: Glioblastoma, IDH-wildtype Disease Status: Newly Diagnosed / Treatment Naive Performance Status: Good Performance Status and/or Younger Patient MGMT Promoter Methylation Status: Unmethylated Intent of Therapy: Non-Curative / Palliative Intent, Discussed with Patient

## 2021-11-22 NOTE — Progress Notes (Signed)
[facility] [place], [place] [address] [phone]   Interval Evaluation  Date of Service: 11/22/21 Patient Name: Debbie Horton Patient MRN: [id number] Patient DOB: 02-Jun-1965 Provider: Ventura Sellers, MD  Identifying Statement:  Debbie Horton is a 56 y.o. female with left occipital glioblastoma    Oncologic History: Oncology History  Glioblastoma, IDH-wildtype ([place])  07/06/2021 Surgery   Left occipital craniotomy, resection with Dr. Reatha Armour; path is Glioblastoma, IDHwt    - 09/24/2021 Radiation Therapy   Completes IMRT[facility]TMZ at [facility]   10/25/2021 -  Chemotherapy   Initiates cycle #1 adjuvant Temodar through [place] team     Biomarkers:  MGMT Unknown.  IDH 1/2 Wild type.  EGFR Unknown  TERT Unknown   Interval History: Debbie Horton presents today for follow up, now having completed first cycle of adjuvant Temodar.  She tolerated treatment well without complication.  Denies headaches, seizures.  H+P (07/22/21) Patient presented to medical attention last month with new onset headaches, blurry vision and episodes of confusion.  She obtained CNS imaging in the hospital which demonstrated an enhancing mass in the left occipital lobe.  She underwent craniotomy, resection with Dr. Reatha Armour; path demonstrated glioblastoma IDHwt.  Following surgery, she has tapered off steroids.  She feels close to her baseline, aside from right visual field impairment, not improved.  She is fully functional, independent, and otherwise healthy.  Medications: Current Outpatient Medications on File Prior to Visit  Medication Sig Dispense Refill   acetaminophen (TYLENOL) 500 MG tablet Take 1,000 mg by mouth every 8 (eight) hours as needed for headache.     ALPRAZolam (XANAX) 0.25 MG tablet Take 1 tablet (0.25 mg total) by mouth 2 (two) times daily as needed for anxiety. 30 tablet 0   estradiol (CLIMARA - DOSED IN MG/24 HR) 0.05  mg/24hr patch Place 0.05 mg onto the skin every Wednesday.     ibuprofen (ADVIL) 200 MG tablet Take 400 mg by mouth every 8 (eight) hours as needed for headache.     levocetirizine (XYZAL) 5 MG tablet Take 5 mg by mouth daily as needed (seasonal allergies).     progesterone (PROMETRIUM) 200 MG capsule Take 200 mg by mouth at bedtime.     No current facility-administered medications on file prior to visit.    Allergies:  Allergies  Allergen Reactions   Latex Rash   Penicillins Rash   Phenergan [Promethazine] Palpitations   Sulfa Antibiotics Rash   Past Medical History: No past medical history on file. Past Surgical History:  Past Surgical History:  Procedure Laterality Date   APPLICATION OF CRANIAL NAVIGATION N/A 07/06/2021   Procedure: APPLICATION OF CRANIAL NAVIGATION;  Surgeon: Dawley, Theodoro Doing, DO;  Location: [place];  Service: Neurosurgery;  Laterality: N/A;   CRANIOTOMY N/A 07/06/2021   Procedure: LEFT OCCIPITAL CRANIOTOMY FOR TUMOR EXCISION;  Surgeon: Dawley, Theodoro Doing, DO;  Location: [place];  Service: Neurosurgery;  Laterality: N/A;   Social History:  Social History   Socioeconomic History   Marital status: Married    Spouse name: Not on file   Number of children: Not on file   Years of education: Not on file   Highest education level: Not on file  Occupational History   Occupation: [employer]  Tobacco Use   Smoking status: Never   Smokeless tobacco: Never  Vaping Use   Vaping Use: Never used  Substance and Sexual Activity  Alcohol use: Yes    Comment: occasional   Drug use: Never   Sexual activity: Yes    Birth control/protection: Post-menopausal  Other Topics Concern   Not on file  Social History Narrative   2 children (57 yr son and 50 yr daughter).   Social Determinants of Health   Financial Resource Strain: Not on file  Food Insecurity: Not on file  Transportation Needs: Not on file  Physical Activity: Not on file  Stress:  Not on file  Social Connections: Not on file  Intimate Partner Violence: Not on file   Family History: No family history on file.  Review of Systems: Constitutional: Doesn't report fevers, chills or abnormal weight loss Eyes: Doesn't report blurriness of vision Ears, nose, mouth, throat, and face: Doesn't report sore throat Respiratory: Doesn't report cough, dyspnea or wheezes Cardiovascular: Doesn't report palpitation, chest discomfort  Gastrointestinal:  Doesn't report nausea, constipation, diarrhea GU: Doesn't report incontinence Skin: Doesn't report skin rashes Neurological: Per HPI Musculoskeletal: Doesn't report joint pain Behavioral/Psych: Doesn't report anxiety  Physical Exam: Vitals:   11/22/21 0905  BP: 118/73  Pulse: 78  Resp: 16  Temp: 98.3 F (36.8 C)  SpO2: 100%    KPS: 80. General: Alert, cooperative, pleasant, in no acute distress Head: Normal EENT: No conjunctival injection or scleral icterus.  Lungs: Resp effort normal Cardiac: Regular rate Abdomen: Non-distended abdomen Skin: No rashes cyanosis or petechiae. Extremities: No clubbing or edema  Neurologic Exam: Mental Status: Awake, alert, attentive to examiner. Oriented to self and environment. Language is fluent with intact comprehension.  Cranial Nerves: Visual acuity is grossly normal. R hemianopia. Extra-ocular movements intact. No ptosis. Face is symmetric Motor: Tone and bulk are normal. Power is full in both arms and legs. Reflexes are symmetric, no pathologic reflexes present.  Sensory: Intact to light touch Gait: Normal.   Labs: I have reviewed the data as listed    Component Value Date/Time   NA 137 07/07/2021 0529   K 3.8 07/07/2021 0529   CL 107 07/07/2021 0529   CO2 24 07/07/2021 0529   GLUCOSE 122 (H) 07/07/2021 0529   BUN 15 07/07/2021 0529   CREATININE 0.66 07/07/2021 0529   CALCIUM 8.1 (L) 07/07/2021 0529   GFRNONAA >60 07/07/2021 0529   Lab Results  Component Value  Date   WBC 16.2 (H) 07/07/2021   NEUTROABS 5.8 06/30/2021   HGB 11.4 (L) 07/07/2021   HCT 33.6 (L) 07/07/2021   MCV 91.3 07/07/2021   PLT 184 07/07/2021     Assessment/Plan Glioblastoma, IDH-wildtype ([place])  [name] is clinically stable today, now having completed cycle #1 adjuvant 5-day Temodar.   We recommended continuing treatment with Temozolomide, dose increased to 200 mg/m2, on for five days and off for twenty three days in twenty eight day cycles. The patient will have a complete blood count performed on days 21 and 28 of each cycle, and a comprehensive metabolic panel performed on day 28 of each cycle. Labs may need to be performed more often. Zofran will prescribed for home use for nausea/vomiting.   Informed consent was obtained verbally at bedside to proceed with oral chemotherapy.  Chemotherapy should be held for the following:  ANC less than 1,000  Platelets less than 100,000  LFT or creatinine greater than 2x ULN  If clinical concerns/contraindications develop  We ask that [name] return to clinic in 1 months following next brain MRI, or sooner as needed.  All questions were answered. The  patient knows to call the clinic with any problems, questions or concerns. No barriers to learning were detected.  The total time spent in the encounter was 30 minutes and more than 50% was on counseling and review of test results   Ventura Sellers, MD Medical Director of Neuro-Oncology [facility] 11/22/21 8:59 AM

## 2021-11-23 ENCOUNTER — Other Ambulatory Visit: Payer: Self-pay

## 2021-11-24 ENCOUNTER — Other Ambulatory Visit: Payer: Self-pay

## 2021-11-25 ENCOUNTER — Other Ambulatory Visit: Payer: Self-pay

## 2021-11-28 ENCOUNTER — Encounter: Payer: Self-pay | Admitting: Internal Medicine

## 2021-12-02 ENCOUNTER — Encounter (HOSPITAL_COMMUNITY): Payer: Self-pay

## 2021-12-07 ENCOUNTER — Other Ambulatory Visit: Payer: Self-pay | Admitting: Radiation Therapy

## 2021-12-09 ENCOUNTER — Ambulatory Visit (HOSPITAL_COMMUNITY)
Admission: RE | Admit: 2021-12-09 | Discharge: 2021-12-09 | Disposition: A | Discharge status: Home / Self Care | Payer: 59 | Source: Ambulatory Visit | Attending: Internal Medicine | Admitting: Internal Medicine

## 2021-12-09 DIAGNOSIS — C719 Malignant neoplasm of brain, unspecified: Secondary | ICD-10-CM | POA: Insufficient documentation

## 2021-12-09 MED ORDER — GADOBUTROL 1 MMOL/ML IV SOLN
6.0000 mL | Freq: Once | INTRAVENOUS | Status: AC | PRN
  Administered 2021-12-09: 6 mL via INTRAVENOUS

## 2021-12-13 ENCOUNTER — Telehealth: Payer: Self-pay | Admitting: *Deleted

## 2021-12-13 ENCOUNTER — Other Ambulatory Visit: Payer: Self-pay | Admitting: Internal Medicine

## 2021-12-13 ENCOUNTER — Inpatient Hospital Stay: Payer: 59

## 2021-12-13 DIAGNOSIS — C719 Malignant neoplasm of brain, unspecified: Secondary | ICD-10-CM

## 2021-12-13 NOTE — Telephone Encounter (Signed)
Pushed imaging out to [facility] for her future follow up.

## 2021-12-13 NOTE — Telephone Encounter (Signed)
Patient will have labs and visit with MD prior to refill.

## 2021-12-16 ENCOUNTER — Other Ambulatory Visit: Payer: Self-pay | Admitting: Internal Medicine

## 2021-12-16 ENCOUNTER — Encounter: Payer: Self-pay | Admitting: *Deleted

## 2021-12-16 DIAGNOSIS — C719 Malignant neoplasm of brain, unspecified: Secondary | ICD-10-CM

## 2021-12-20 ENCOUNTER — Inpatient Hospital Stay (HOSPITAL_BASED_OUTPATIENT_CLINIC_OR_DEPARTMENT_OTHER): Payer: 59 | Admitting: Internal Medicine

## 2021-12-20 ENCOUNTER — Other Ambulatory Visit: Payer: Self-pay

## 2021-12-20 ENCOUNTER — Inpatient Hospital Stay: Payer: 59 | Attending: Internal Medicine

## 2021-12-20 VITALS — BP 112/69 | HR 85 | Temp 97.9°F | Resp 15 | Wt 125.9 lb

## 2021-12-20 DIAGNOSIS — C719 Malignant neoplasm of brain, unspecified: Secondary | ICD-10-CM

## 2021-12-20 DIAGNOSIS — C714 Malignant neoplasm of occipital lobe: Secondary | ICD-10-CM | POA: Insufficient documentation

## 2021-12-20 DIAGNOSIS — K5900 Constipation, unspecified: Secondary | ICD-10-CM | POA: Diagnosis not present

## 2021-12-20 DIAGNOSIS — D759 Disease of blood and blood-forming organs, unspecified: Secondary | ICD-10-CM | POA: Diagnosis not present

## 2021-12-20 LAB — CBC WITH DIFFERENTIAL (CANCER CENTER ONLY)
Abs Immature Granulocytes: 0.01 10*3/uL (ref 0.00–0.07)
Basophils Absolute: 0 10*3/uL (ref 0.0–0.1)
Basophils Relative: 1 %
Eosinophils Absolute: 0.1 10*3/uL (ref 0.0–0.5)
Eosinophils Relative: 3 %
HCT: 36.5 % (ref 36.0–46.0)
Hemoglobin: 12.3 g/dL (ref 12.0–15.0)
Immature Granulocytes: 0 %
Lymphocytes Relative: 23 %
Lymphs Abs: 0.7 10*3/uL (ref 0.7–4.0)
MCH: 30.8 pg (ref 26.0–34.0)
MCHC: 33.7 g/dL (ref 30.0–36.0)
MCV: 91.5 fL (ref 80.0–100.0)
Monocytes Absolute: 0.5 10*3/uL (ref 0.1–1.0)
Monocytes Relative: 14 %
Neutro Abs: 1.9 10*3/uL (ref 1.7–7.7)
Neutrophils Relative %: 59 %
Platelet Count: 104 10*3/uL — ABNORMAL LOW (ref 150–400)
RBC: 3.99 MIL/uL (ref 3.87–5.11)
RDW: 12 % (ref 11.5–15.5)
WBC Count: 3.2 10*3/uL — ABNORMAL LOW (ref 4.0–10.5)
nRBC: 0 % (ref 0.0–0.2)

## 2021-12-20 LAB — CMP (CANCER CENTER ONLY)
ALT: 10 U/L (ref 0–44)
AST: 16 U/L (ref 15–41)
Albumin: 4.4 g/dL (ref 3.5–5.0)
Alkaline Phosphatase: 42 U/L (ref 38–126)
Anion gap: 5 (ref 5–15)
BUN: 14 mg/dL (ref 6–20)
CO2: 29 mmol/L (ref 22–32)
Calcium: 9.3 mg/dL (ref 8.9–10.3)
Chloride: 106 mmol/L (ref 98–111)
Creatinine: 0.86 mg/dL (ref 0.44–1.00)
GFR, Estimated: >60 mL/min (ref >60)
Glucose, Bld: 82 mg/dL (ref 70–99)
Potassium: 3.9 mmol/L (ref 3.5–5.1)
Sodium: 140 mmol/L (ref 135–145)
Total Bilirubin: 0.9 mg/dL (ref 0.3–1.2)
Total Protein: 6.7 g/dL (ref 6.5–8.1)

## 2021-12-20 MED ORDER — TEMOZOLOMIDE 20 MG PO CAPS: 80.0000 mg | ORAL_CAPSULE | Freq: Every day | ORAL | 0 refills | Status: DC

## 2021-12-20 NOTE — Progress Notes (Signed)
[place] at [place], [place] [address] [phone]   Interval Evaluation  Date of Service: 12/20/21 Patient Name: Debbie Horton Patient MRN: [id number] Patient DOB: 12-14-1965 Provider: Ventura Sellers, MD  Identifying Statement:  Debbie Horton is a 56 y.o. female with left occipital glioblastoma    Oncologic History: Oncology History  Glioblastoma, IDH-wildtype ([place])  07/06/2021 Surgery   Left occipital craniotomy, resection with Dr. Reatha Armour; path is Glioblastoma, IDHwt    - 09/24/2021 Radiation Therapy   Completes IMRT+TMZ at [facility]   10/25/2021 -  Chemotherapy   Initiates cycle #1 adjuvant Temodar through [place] team   11/23/2021 -  Chemotherapy   Patient is on Treatment Plan : BRAIN GLIOBLASTOMA Consolidation Temozolomide Days 1-5 q28 Days        Biomarkers:  MGMT Unknown.  IDH 1/2 Wild type.  EGFR Unknown  TERT Unknown   Interval History: Debbie Horton presents today for follow up, now having completed cycle #2 of adjuvant Temodar.  Did have fatigue, nausea, lethargy and headache during this past cycle.  Constipation has been an issue as well. Denies seizures.  Had visit at [facility] last week.  H+P (07/22/21) Patient presented to medical attention last month with new onset headaches, blurry vision and episodes of confusion.  She obtained CNS imaging in the hospital which demonstrated an enhancing mass in the left occipital lobe.  She underwent craniotomy, resection with Dr. Reatha Armour; path demonstrated glioblastoma IDHwt.  Following surgery, she has tapered off steroids.  She feels close to her baseline, aside from right visual field impairment, not improved.  She is fully functional, independent, and otherwise healthy.  Medications: Current Outpatient Medications on File Prior to Visit  Medication Sig Dispense Refill   acetaminophen (TYLENOL) 500 MG tablet Take 1,000 mg by mouth every 8 (eight)  hours as needed for headache.     ALPRAZolam (XANAX) 0.25 MG tablet Take 1 tablet (0.25 mg total) by mouth 2 (two) times daily as needed for anxiety. 30 tablet 0   estradiol (CLIMARA - DOSED IN MG/24 HR) 0.05 mg/24hr patch Place 0.05 mg onto the skin every Wednesday.     ibuprofen (ADVIL) 200 MG tablet Take 400 mg by mouth every 8 (eight) hours as needed for headache.     levocetirizine (XYZAL) 5 MG tablet Take 5 mg by mouth daily as needed (seasonal allergies).     ondansetron (ZOFRAN) 8 MG tablet Take 1 tablet (8 mg total) by mouth every 8 (eight) hours as needed for nausea or vomiting. May take 30-60 minutes prior to Temodar administration if nausea/vomiting occurs as needed. 30 tablet 1   polyethylene glycol (MIRALAX / GLYCOLAX) 17 g packet Take 17 g by mouth daily as needed.     progesterone (PROMETRIUM) 200 MG capsule Take 200 mg by mouth at bedtime.     temozolomide (TEMODAR) 140 MG capsule Take 1 capsule (140 mg total) by mouth daily. May take on an empty stomach to decrease nausea & vomiting. 5 capsule 0   temozolomide (TEMODAR) 180 MG capsule Take 1 capsule (180 mg total) by mouth daily. May take on an empty stomach to decrease nausea & vomiting. 5 capsule 0   No current facility-administered medications on file prior to visit.    Allergies:  Allergies  Allergen Reactions   Shellfish-Derived Products Nausea Only   Latex Rash   Penicillins Rash   Phenergan [Promethazine] Palpitations   Sulfa Antibiotics Rash  Past Medical History: No past medical history on file. Past Surgical History:  Past Surgical History:  Procedure Laterality Date   APPLICATION OF CRANIAL NAVIGATION N/A 07/06/2021   Procedure: APPLICATION OF CRANIAL NAVIGATION;  Surgeon: Dawley, Theodoro Doing, DO;  Location: [facility];  Service: Neurosurgery;  Laterality: N/A;   CRANIOTOMY N/A 07/06/2021   Procedure: LEFT OCCIPITAL CRANIOTOMY FOR TUMOR EXCISION;  Surgeon: Dawley, Theodoro Doing, DO;  Location: [place];  Service: Neurosurgery;   Laterality: N/A;   Social History:  Social History   Socioeconomic History   Marital status: Married    Spouse name: Not on file   Number of children: Not on file   Years of education: Not on file   Highest education level: Not on file  Occupational History   Occupation: [employer]  Tobacco Use   Smoking status: Never   Smokeless tobacco: Never  Vaping Use   Vaping Use: Never used  Substance and Sexual Activity   Alcohol use: Yes    Comment: occasional   Drug use: Never   Sexual activity: Yes    Birth control/protection: Post-menopausal  Other Topics Concern   Not on file  Social History Narrative   2 children (41 yr son and 44 yr daughter).   Social Determinants of Health   Financial Resource Strain: Not on file  Food Insecurity: Not on file  Transportation Needs: Not on file  Physical Activity: Not on file  Stress: Not on file  Social Connections: Not on file  Intimate Partner Violence: Not on file   Family History: No family history on file.  Review of Systems: Constitutional: Doesn't report fevers, chills or abnormal weight loss Eyes: Doesn't report blurriness of vision Ears, nose, mouth, throat, and face: Doesn't report sore throat Respiratory: Doesn't report cough, dyspnea or wheezes Cardiovascular: Doesn't report palpitation, chest discomfort  Gastrointestinal:  Doesn't report nausea, constipation, diarrhea GU: Doesn't report incontinence Skin: Doesn't report skin rashes Neurological: Per HPI Musculoskeletal: Doesn't report joint pain Behavioral/Psych: Doesn't report anxiety  Physical Exam: Vitals:   12/20/21 0945  BP: 112/69  Pulse: 85  Resp: 15  Temp: 97.9 F (36.6 C)  SpO2: 98%     KPS: 80. General: Alert, cooperative, pleasant, in no acute distress Head: Normal EENT: No conjunctival injection or scleral icterus.  Lungs: Resp effort normal Cardiac: Regular rate Abdomen: Non-distended abdomen Skin: No  rashes cyanosis or petechiae. Extremities: No clubbing or edema  Neurologic Exam: Mental Status: Awake, alert, attentive to examiner. Oriented to self and environment. Language is fluent with intact comprehension.  Cranial Nerves: Visual acuity is grossly normal. R hemianopia. Extra-ocular movements intact. No ptosis. Face is symmetric Motor: Tone and bulk are normal. Power is full in both arms and legs. Reflexes are symmetric, no pathologic reflexes present.  Sensory: Intact to light touch Gait: Normal.   Labs: I have reviewed the data as listed    Component Value Date/Time   NA 139 11/22/2021 0851   K 3.7 11/22/2021 0851   CL 104 11/22/2021 0851   CO2 30 11/22/2021 0851   GLUCOSE 86 11/22/2021 0851   BUN 19 11/22/2021 0851   CREATININE 0.85 11/22/2021 0851   CALCIUM 9.1 11/22/2021 0851   PROT 7.0 11/22/2021 0851   ALBUMIN 4.4 11/22/2021 0851   AST 17 11/22/2021 0851   ALT 10 11/22/2021 0851   ALKPHOS 48 11/22/2021 0851   BILITOT 0.8 11/22/2021 0851   GFRNONAA >60 11/22/2021 0851   Lab Results  Component Value Date   WBC 3.2 (L) 12/20/2021   NEUTROABS 1.9 12/20/2021   HGB 12.3 12/20/2021   HCT 36.5 12/20/2021   MCV 91.5 12/20/2021   PLT 104 (L) 12/20/2021    Imaging:  [facility] Clinician Interpretation: I have personally reviewed the CNS images as listed.  My interpretation, in the context of the patient's clinical presentation, is treatment effect vs true progression  MR BRAIN W WO CONTRAST  Result Date: 12/12/2021 CLINICAL DATA:  History of glioblastoma with left occipital craniotomy for tumor excision. EXAM: MRI HEAD WITHOUT AND WITH CONTRAST TECHNIQUE: Multiplanar, multiecho pulse sequences of the brain and surrounding structures were obtained without and with intravenous contrast. CONTRAST:  63m GADAVIST GADOBUTROL 1 MMOL/ML IV SOLN COMPARISON:  Brain MRI 10/20/2021 from an outside institution FINDINGS: Brain: Postsurgical changes reflecting left parieto-occipital  craniotomy for mass resection are again seen. Resection cavity is grossly similar in size with interval evolution of the internal blood products. FLAIR signal abnormality surrounding the resection cavity is overall similar to the prior study, most notable along the inferior aspect (11-16) and in the peritrigonal white matter (11-22. Curvilinear enhancement along the inferomedial aspect of the resection cavity has increased (17-5, 16-64). Linear enhancement along the superior margin is unchanged). There is no significant mass effect.  There is no midline shift. The extra-axial collection underlying the craniotomy site measuring up to 4 mm in thickness is decreased in size from 5-6 mm. There is minimal mass effect on the underlying brain parenchyma due to this collection. There is no acute intracranial hemorrhage, extra-axial fluid collection, or acute infarct. Background parenchymal volume is normal. The ventricles are normal in size. Vascular: Normal flow voids. Skull and upper cervical spine: Normal marrow signal. Sinuses/Orbits: The paranasal sinuses are clear. The globes and orbits are unremarkable. Other: None. IMPRESSION: Slightly increased enhancement along the inferior and medial margins of the left occipital resection cavity with unchanged surrounding FLAIR signal abnormality since 10/20/2021, suspicious for residual tumor. Electronically Signed   By: PValetta MoleM.D.   On: 12/12/2021 18:03     Assessment/Plan Glioblastoma, IDH-wildtype ([place]  [place] Garfinkel[place]aireis clinically stable today, now having completed cycle #2 adjuvant 5-day Temodar.  MRI brain demonstrates pseudo-progression vs progression, localized.  Due to poor tolerance, cytopenias, [facility] team recommended changing therapy to daily metronomic Temozolomide 50 mg/m2. The patient will have a complete blood count performed on days 14 and 28 of each cycle, and a comprehensive metabolic panel performed on day 28 of each cycle. Labs may need  to be performed more often. Zofran will prescribed for home use for nausea/vomiting.   Informed consent was obtained verbally at bedside to proceed with oral chemotherapy.  Chemotherapy should be held for the following:  ANC less than 1,000  Platelets less than 100,000  LFT or creatinine greater than 2x ULN  If clinical concerns/contraindications develop  We will check blood counts again in 2 weeks given modest cytopenia today.  We ask that BLuciano Cutterreturn to clinic in 1 months following next brain MRI, or sooner as needed.  All questions were answered. The patient knows to call the clinic with any problems, questions or concerns. No barriers to learning were detected.  The total time spent in the encounter was 30 minutes and more than 50% was on counseling and review of test results   ZVentura Sellers MD Medical Director of Neuro-Oncology CNorth [facility] Healthat WPemiscot11/06/23 9:42 AM

## 2021-12-21 ENCOUNTER — Telehealth: Payer: Self-pay | Admitting: Internal Medicine

## 2021-12-21 NOTE — Telephone Encounter (Signed)
Per 11/6 los called and left message for pt about appointments

## 2021-12-23 ENCOUNTER — Other Ambulatory Visit: Payer: Self-pay

## 2021-12-28 ENCOUNTER — Encounter: Payer: Self-pay | Admitting: *Deleted

## 2021-12-30 ENCOUNTER — Other Ambulatory Visit: Payer: 59

## 2021-12-30 ENCOUNTER — Ambulatory Visit: Payer: 59 | Admitting: Internal Medicine

## 2021-12-30 ENCOUNTER — Inpatient Hospital Stay: Payer: 59 | Admitting: Internal Medicine

## 2021-12-30 ENCOUNTER — Other Ambulatory Visit: Payer: Self-pay

## 2021-12-30 ENCOUNTER — Encounter: Payer: Self-pay | Admitting: *Deleted

## 2021-12-30 ENCOUNTER — Inpatient Hospital Stay: Payer: 59

## 2021-12-30 DIAGNOSIS — C719 Malignant neoplasm of brain, unspecified: Secondary | ICD-10-CM

## 2021-12-30 DIAGNOSIS — C714 Malignant neoplasm of occipital lobe: Secondary | ICD-10-CM | POA: Diagnosis not present

## 2021-12-30 LAB — CMP (CANCER CENTER ONLY)
ALT: 9 U/L (ref 0–44)
AST: 15 U/L (ref 15–41)
Albumin: 4.5 g/dL (ref 3.5–5.0)
Alkaline Phosphatase: 47 U/L (ref 38–126)
Anion gap: 6 (ref 5–15)
BUN: 18 mg/dL (ref 6–20)
CO2: 29 mmol/L (ref 22–32)
Calcium: 9.2 mg/dL (ref 8.9–10.3)
Chloride: 106 mmol/L (ref 98–111)
Creatinine: 0.93 mg/dL (ref 0.44–1.00)
GFR, Estimated: >60 mL/min (ref >60)
Glucose, Bld: 95 mg/dL (ref 70–99)
Potassium: 3.6 mmol/L (ref 3.5–5.1)
Sodium: 141 mmol/L (ref 135–145)
Total Bilirubin: 0.8 mg/dL (ref 0.3–1.2)
Total Protein: 6.9 g/dL (ref 6.5–8.1)

## 2021-12-30 LAB — CBC WITH DIFFERENTIAL (CANCER CENTER ONLY)
Abs Immature Granulocytes: 0.01 10*3/uL (ref 0.00–0.07)
Basophils Absolute: 0 10*3/uL (ref 0.0–0.1)
Basophils Relative: 1 %
Eosinophils Absolute: 0 10*3/uL (ref 0.0–0.5)
Eosinophils Relative: 1 %
HCT: 36.1 % (ref 36.0–46.0)
Hemoglobin: 12.3 g/dL (ref 12.0–15.0)
Immature Granulocytes: 0 %
Lymphocytes Relative: 14 %
Lymphs Abs: 0.7 10*3/uL (ref 0.7–4.0)
MCH: 31.2 pg (ref 26.0–34.0)
MCHC: 34.1 g/dL (ref 30.0–36.0)
MCV: 91.6 fL (ref 80.0–100.0)
Monocytes Absolute: 0.4 10*3/uL (ref 0.1–1.0)
Monocytes Relative: 8 %
Neutro Abs: 3.5 10*3/uL (ref 1.7–7.7)
Neutrophils Relative %: 76 %
Platelet Count: 142 10*3/uL — ABNORMAL LOW (ref 150–400)
RBC: 3.94 MIL/uL (ref 3.87–5.11)
RDW: 12.2 % (ref 11.5–15.5)
WBC Count: 4.6 10*3/uL (ref 4.0–10.5)
nRBC: 0 % (ref 0.0–0.2)

## 2022-01-10 ENCOUNTER — Other Ambulatory Visit: Payer: Self-pay | Admitting: Internal Medicine

## 2022-01-10 ENCOUNTER — Encounter: Payer: Self-pay | Admitting: Internal Medicine

## 2022-01-10 DIAGNOSIS — C719 Malignant neoplasm of brain, unspecified: Secondary | ICD-10-CM

## 2022-01-11 ENCOUNTER — Other Ambulatory Visit: Payer: Self-pay | Admitting: *Deleted

## 2022-01-11 DIAGNOSIS — C719 Malignant neoplasm of brain, unspecified: Secondary | ICD-10-CM

## 2022-01-11 MED ORDER — ONDANSETRON HCL 8 MG PO TABS: 8.0000 mg | ORAL_TABLET | Freq: Three times a day (TID) | ORAL | 1 refills | Status: AC | PRN

## 2022-01-12 ENCOUNTER — Encounter: Payer: Self-pay | Admitting: Internal Medicine

## 2022-01-12 NOTE — Telephone Encounter (Signed)
Received call from [facility] stating pt needs refill on Temozolomide.  Reviewed chart & script escribed on 01/10/22.  Returned call to [facility] verified that script is set up for deliver.

## 2022-01-20 ENCOUNTER — Other Ambulatory Visit: Payer: Self-pay | Admitting: Radiation Therapy

## 2022-01-21 ENCOUNTER — Other Ambulatory Visit: Payer: Self-pay

## 2022-01-31 ENCOUNTER — Other Ambulatory Visit: Payer: Self-pay | Admitting: Internal Medicine

## 2022-01-31 ENCOUNTER — Ambulatory Visit (HOSPITAL_COMMUNITY)
Admission: RE | Admit: 2022-01-31 | Discharge: 2022-01-31 | Disposition: A | Discharge status: Home / Self Care | Payer: 59 | Source: Ambulatory Visit | Attending: Internal Medicine | Admitting: Internal Medicine

## 2022-01-31 DIAGNOSIS — C719 Malignant neoplasm of brain, unspecified: Secondary | ICD-10-CM | POA: Insufficient documentation

## 2022-01-31 MED ORDER — GADOBUTROL 1 MMOL/ML IV SOLN
5.5000 mL | Freq: Once | INTRAVENOUS | Status: AC | PRN
  Administered 2022-01-31: 5.5 mL via INTRAVENOUS

## 2022-02-03 ENCOUNTER — Inpatient Hospital Stay: Payer: 59 | Attending: Internal Medicine | Admitting: Internal Medicine

## 2022-02-03 ENCOUNTER — Inpatient Hospital Stay: Payer: 59

## 2022-02-03 VITALS — BP 101/60 | HR 70 | Temp 97.7°F | Resp 16 | Ht 64.5 in | Wt 126.3 lb

## 2022-02-03 DIAGNOSIS — C714 Malignant neoplasm of occipital lobe: Secondary | ICD-10-CM | POA: Diagnosis not present

## 2022-02-03 DIAGNOSIS — C719 Malignant neoplasm of brain, unspecified: Secondary | ICD-10-CM

## 2022-02-03 DIAGNOSIS — R112 Nausea with vomiting, unspecified: Secondary | ICD-10-CM | POA: Insufficient documentation

## 2022-02-03 LAB — CMP (CANCER CENTER ONLY)
ALT: 14 U/L (ref 0–44)
AST: 18 U/L (ref 15–41)
Albumin: 4.1 g/dL (ref 3.5–5.0)
Alkaline Phosphatase: 44 U/L (ref 38–126)
Anion gap: 5 (ref 5–15)
BUN: 19 mg/dL (ref 6–20)
CO2: 29 mmol/L (ref 22–32)
Calcium: 9 mg/dL (ref 8.9–10.3)
Chloride: 107 mmol/L (ref 98–111)
Creatinine: 0.83 mg/dL (ref 0.44–1.00)
GFR, Estimated: >60 mL/min (ref >60)
Glucose, Bld: 82 mg/dL (ref 70–99)
Potassium: 3.8 mmol/L (ref 3.5–5.1)
Sodium: 141 mmol/L (ref 135–145)
Total Bilirubin: 0.8 mg/dL (ref 0.3–1.2)
Total Protein: 6.5 g/dL (ref 6.5–8.1)

## 2022-02-03 LAB — CBC WITH DIFFERENTIAL (CANCER CENTER ONLY)
Abs Immature Granulocytes: 0.01 10*3/uL (ref 0.00–0.07)
Basophils Absolute: 0 10*3/uL (ref 0.0–0.1)
Basophils Relative: 1 %
Eosinophils Absolute: 0.1 10*3/uL (ref 0.0–0.5)
Eosinophils Relative: 2 %
HCT: 36.1 % (ref 36.0–46.0)
Hemoglobin: 12.3 g/dL (ref 12.0–15.0)
Immature Granulocytes: 0 %
Lymphocytes Relative: 16 %
Lymphs Abs: 0.7 10*3/uL (ref 0.7–4.0)
MCH: 31.6 pg (ref 26.0–34.0)
MCHC: 34.1 g/dL (ref 30.0–36.0)
MCV: 92.8 fL (ref 80.0–100.0)
Monocytes Absolute: 0.5 10*3/uL (ref 0.1–1.0)
Monocytes Relative: 11 %
Neutro Abs: 3.1 10*3/uL (ref 1.7–7.7)
Neutrophils Relative %: 70 %
Platelet Count: 148 10*3/uL — ABNORMAL LOW (ref 150–400)
RBC: 3.89 MIL/uL (ref 3.87–5.11)
RDW: 12.5 % (ref 11.5–15.5)
WBC Count: 4.3 10*3/uL (ref 4.0–10.5)
nRBC: 0 % (ref 0.0–0.2)

## 2022-02-03 NOTE — Progress Notes (Signed)
[place] at [place], [place] [address] [phone]   Interval Evaluation  Date of Service: 02/03/22 Patient Name: Debbie Horton Patient MRN: [id number] Patient DOB: 1965-11-24 Provider: Ventura Sellers, MD  Identifying Statement:  Debbie Horton is a 56 y.o. female with left occipital glioblastoma    Oncologic History: Oncology History  Glioblastoma, IDH-wildtype ([place])  07/06/2021 Surgery   Left occipital craniotomy, resection with Dr. Reatha Armour; path is Glioblastoma, IDHwt    - 09/24/2021 Radiation Therapy   Completes IMRT[facility]TMZ at [facility]   10/25/2021 -  Chemotherapy   Initiates cycle #1 adjuvant Temodar through [place] team   11/23/2021 -  Chemotherapy   Patient is on Treatment Plan : BRAIN GLIOBLASTOMA Consolidation Temozolomide Days 1-5 q28 Days        Biomarkers:  MGMT Unknown.  IDH 1/2 Wild type.  EGFR Unknown  TERT Unknown   Interval History: Debbie Horton presents today for follow up, now having completed cycle #3 of (now) metronomic daily Temodar.  No issues tolerating the chemo.  Constipation has been improved. Denies seizures.  Had visit at [facility] earlier this week.  H+P (07/22/21) Patient presented to medical attention last month with new onset headaches, blurry vision and episodes of confusion.  She obtained CNS imaging in the hospital which demonstrated an enhancing mass in the left occipital lobe.  She underwent craniotomy, resection with Dr. Reatha Armour; path demonstrated glioblastoma IDHwt.  Following surgery, she has tapered off steroids.  She feels close to her baseline, aside from right visual field impairment, not improved.  She is fully functional, independent, and otherwise healthy.  Medications: Current Outpatient Medications on File Prior to Visit  Medication Sig Dispense Refill   acetaminophen (TYLENOL) 500 MG tablet Take 1,000 mg by mouth every 8 (eight) hours as needed for  headache.     estradiol (CLIMARA - DOSED IN MG/24 HR) 0.05 mg/24hr patch Place 0.05 mg onto the skin every Wednesday.     ibuprofen (ADVIL) 200 MG tablet Take 400 mg by mouth every 8 (eight) hours as needed for headache.     levocetirizine (XYZAL) 5 MG tablet Take 5 mg by mouth daily as needed (seasonal allergies).     ondansetron (ZOFRAN) 8 MG tablet Take 1 tablet (8 mg total) by mouth every 8 (eight) hours as needed for nausea or vomiting. May take 30-60 minutes prior to Temodar administration if nausea/vomiting occurs as needed. 30 tablet 1   polyethylene glycol (MIRALAX / GLYCOLAX) 17 g packet Take 17 g by mouth daily as needed.     progesterone (PROMETRIUM) 200 MG capsule Take 200 mg by mouth at bedtime.     temozolomide (TEMODAR) 20 MG capsule TAKE 4 CAPSULES BY MOUTH AT  BEDTIME (MAY TAKE ON EMPTY  STOMACH TO DECREASE NAUSEA AND  VOMITING) 112 capsule 0   No current facility-administered medications on file prior to visit.    Allergies:  Allergies  Allergen Reactions   Shellfish-Derived Products Nausea Only   Latex Rash   Penicillins Rash   Phenergan [Promethazine] Palpitations   Sulfa Antibiotics Rash   Past Medical History: No past medical history on file. Past Surgical History:  Past Surgical History:  Procedure Laterality Date   APPLICATION OF CRANIAL NAVIGATION N/A 07/06/2021   Procedure: APPLICATION OF CRANIAL NAVIGATION;  Surgeon: Dawley, Theodoro Doing, DO;  Location: [facility];  Service: Neurosurgery;  Laterality: N/A;   CRANIOTOMY N/A 07/06/2021   Procedure: LEFT OCCIPITAL  CRANIOTOMY FOR TUMOR EXCISION;  Surgeon: Dawley, Theodoro Doing, DO;  Location: [facility];  Service: Neurosurgery;  Laterality: N/A;   Social History:  Social History   Socioeconomic History   Marital status: Married    Spouse name: Not on file   Number of children: Not on file   Years of education: Not on file   Highest education level: Not on file  Occupational History   Occupation: [employer]  Tobacco Use   Smoking status: Never   Smokeless tobacco: Never  Vaping Use   Vaping Use: Never used  Substance and Sexual Activity   Alcohol use: Yes    Comment: occasional   Drug use: Never   Sexual activity: Yes    Birth control/protection: Post-menopausal  Other Topics Concern   Not on file  Social History Narrative   2 children (19 yr son and 74 yr daughter).   Social Determinants of Health   Financial Resource Strain: Not on file  Food Insecurity: Not on file  Transportation Needs: Not on file  Physical Activity: Not on file  Stress: Not on file  Social Connections: Not on file  Intimate Partner Violence: Not on file   Family History: No family history on file.  Review of Systems: Constitutional: Doesn't report fevers, chills or abnormal weight loss Eyes: Doesn't report blurriness of vision Ears, nose, mouth, throat, and face: Doesn't report sore throat Respiratory: Doesn't report cough, dyspnea or wheezes Cardiovascular: Doesn't report palpitation, chest discomfort  Gastrointestinal:  Doesn't report nausea, constipation, diarrhea GU: Doesn't report incontinence Skin: Doesn't report skin rashes Neurological: Per HPI Musculoskeletal: Doesn't report joint pain Behavioral/Psych: Doesn't report anxiety  Physical Exam: Vitals:   02/03/22 0937  BP: 101/60  Pulse: 70  Resp: 16  Temp: 97.7 F (36.5 C)  SpO2: 99%     KPS: 80. General: Alert, cooperative, pleasant, in no acute distress Head: Normal EENT: No conjunctival injection or scleral icterus.  Lungs: Resp effort normal Cardiac: Regular rate Abdomen: Non-distended abdomen Skin: No rashes cyanosis or petechiae. Extremities: No clubbing or edema  Neurologic Exam: Mental Status: Awake, alert, attentive to examiner. Oriented to self and environment. Language is fluent with intact comprehension.  Cranial Nerves: Visual acuity is grossly normal. R hemianopia. Extra-ocular movements  intact. No ptosis. Face is symmetric Motor: Tone and bulk are normal. Power is full in both arms and legs. Reflexes are symmetric, no pathologic reflexes present.  Sensory: Intact to light touch Gait: Normal.   Labs: I have reviewed the data as listed    Component Value Date/Time   NA 141 12/30/2021 1403   K 3.6 12/30/2021 1403   CL 106 12/30/2021 1403   CO2 29 12/30/2021 1403   GLUCOSE 95 12/30/2021 1403   BUN 18 12/30/2021 1403   CREATININE 0.93 12/30/2021 1403   CALCIUM 9.2 12/30/2021 1403   PROT 6.9 12/30/2021 1403   ALBUMIN 4.5 12/30/2021 1403   AST 15 12/30/2021 1403   ALT 9 12/30/2021 1403   ALKPHOS 47 12/30/2021 1403   BILITOT 0.8 12/30/2021 1403   GFRNONAA >60 12/30/2021 1403   Lab Results  Component Value Date   WBC 4.3 02/03/2022   NEUTROABS 3.1 02/03/2022   HGB 12.3 02/03/2022   HCT 36.1 02/03/2022   MCV 92.8 02/03/2022   PLT 148 (L) 02/03/2022    Imaging:  [facility] Clinician Interpretation: I have personally reviewed the CNS images as listed.  My interpretation, in the context of the patient's clinical  presentation, is stable disease  MR BRAIN W WO CONTRAST  Result Date: 01/31/2022 CLINICAL DATA:  Brain/CNS neoplasm.  Assess treatment response. EXAM: MRI HEAD WITHOUT AND WITH CONTRAST TECHNIQUE: Multiplanar, multiecho pulse sequences of the brain and surrounding structures were obtained without and with intravenous contrast. CONTRAST:  5.69m GADAVIST GADOBUTROL 1 MMOL/ML IV SOLN COMPARISON:  12/09/2021 and previous FINDINGS: Brain: Again no abnormality affects the brainstem, cerebellum or right cerebral hemisphere. Previous left occipital craniotomy for tumor resection in the left occipital lobe. The post resection space is contracting slightly. Enhancing tissue along the inferior medial margin of the resection cavity has not progressed since the immediate prior study, allowing for slight differences in slice position. There has been an increase in T2 and FLAIR  signal within the adjacent white matter. Question if this relates to edema or treatment effect. Small adjacent subdural fluid collection in the left occipital and posterior parietal region is unchanged. Vascular: Major vessels at the base of the brain show flow. Skull and upper cervical spine: Otherwise negative Sinuses/Orbits: Clear/normal Other: None IMPRESSION: Previous left occipital craniotomy for tumor resection. Enhancing tissue along the inferior medial margin of the resection cavity has not progressed since the immediate prior study, allowing for slight differences in slice position. There has been an increase in T2 and FLAIR signal within the adjacent white matter. Question if this relates to edema or treatment effect. Slight contraction of the postoperative cavity. No change in a small adjacent postoperative subdural fluid collection. Electronically Signed   By: MNelson ChimesM.D.   On: 01/31/2022 11:34     Assessment/Plan Glioblastoma, IDH-wildtype ([place]  BLuciano Cutteris clinically stable today, now having completed cycle #3 daily metronomic Temodar.  MRI brain demonstrates stable findings.  Recommended continuing cycle #4 daily metronomic Temozolomide 50 mg/m2. The patient will have a complete blood count performed on days 14 and 28 of each cycle, and a comprehensive metabolic panel performed on day 28 of each cycle. Labs may need to be performed more often. Zofran will prescribed for home use for nausea/vomiting.   Chemotherapy should be held for the following:  ANC less than 1,000  Platelets less than 100,000  LFT or creatinine greater than 2x ULN  If clinical concerns/contraindications develop  We ask that BLuciano Cutterreturn to clinic in 1 months with labs for review, or sooner as needed.  All questions were answered. The patient knows to call the clinic with any problems, questions or concerns. No barriers to learning were detected.  The total time spent in  the encounter was 30 minutes and more than 50% was on counseling and review of test results   ZVentura Sellers MD Medical Director of Neuro-Oncology CNorthwest Ambulatory Surgery Services [facility] Centerat WDes Moines12/21/23 9:49 AM

## 2022-02-04 ENCOUNTER — Telehealth: Payer: Self-pay | Admitting: Internal Medicine

## 2022-02-04 NOTE — Telephone Encounter (Signed)
Called to schedule f/u. Left voicemail with new appointment details.

## 2022-02-06 ENCOUNTER — Other Ambulatory Visit: Payer: Self-pay

## 2022-02-18 ENCOUNTER — Encounter: Payer: Self-pay | Admitting: Internal Medicine

## 2022-02-18 ENCOUNTER — Other Ambulatory Visit: Payer: Self-pay | Admitting: Internal Medicine

## 2022-02-18 DIAGNOSIS — C719 Malignant neoplasm of brain, unspecified: Secondary | ICD-10-CM

## 2022-02-21 ENCOUNTER — Inpatient Hospital Stay: Payer: 59 | Attending: Internal Medicine

## 2022-02-21 ENCOUNTER — Other Ambulatory Visit: Payer: Self-pay | Admitting: Internal Medicine

## 2022-02-21 DIAGNOSIS — C714 Malignant neoplasm of occipital lobe: Secondary | ICD-10-CM | POA: Insufficient documentation

## 2022-02-21 DIAGNOSIS — K5900 Constipation, unspecified: Secondary | ICD-10-CM | POA: Insufficient documentation

## 2022-02-21 DIAGNOSIS — C719 Malignant neoplasm of brain, unspecified: Secondary | ICD-10-CM

## 2022-02-21 DIAGNOSIS — R112 Nausea with vomiting, unspecified: Secondary | ICD-10-CM | POA: Insufficient documentation

## 2022-02-25 ENCOUNTER — Encounter: Payer: Self-pay | Admitting: Internal Medicine

## 2022-02-26 ENCOUNTER — Other Ambulatory Visit: Payer: Self-pay

## 2022-02-28 ENCOUNTER — Other Ambulatory Visit: Payer: Self-pay | Admitting: Radiation Therapy

## 2022-03-02 ENCOUNTER — Other Ambulatory Visit: Payer: Self-pay

## 2022-03-03 ENCOUNTER — Other Ambulatory Visit: Payer: 59

## 2022-03-03 ENCOUNTER — Ambulatory Visit: Payer: 59 | Admitting: Internal Medicine

## 2022-03-07 ENCOUNTER — Inpatient Hospital Stay: Payer: 59

## 2022-03-07 ENCOUNTER — Other Ambulatory Visit: Payer: Self-pay

## 2022-03-07 ENCOUNTER — Inpatient Hospital Stay (HOSPITAL_BASED_OUTPATIENT_CLINIC_OR_DEPARTMENT_OTHER): Payer: 59 | Admitting: Internal Medicine

## 2022-03-07 VITALS — BP 126/64 | HR 95 | Temp 97.9°F | Resp 20 | Wt 125.6 lb

## 2022-03-07 DIAGNOSIS — R112 Nausea with vomiting, unspecified: Secondary | ICD-10-CM | POA: Diagnosis not present

## 2022-03-07 DIAGNOSIS — C714 Malignant neoplasm of occipital lobe: Secondary | ICD-10-CM | POA: Diagnosis not present

## 2022-03-07 DIAGNOSIS — K5900 Constipation, unspecified: Secondary | ICD-10-CM | POA: Diagnosis not present

## 2022-03-07 DIAGNOSIS — C719 Malignant neoplasm of brain, unspecified: Secondary | ICD-10-CM

## 2022-03-07 LAB — CBC WITH DIFFERENTIAL (CANCER CENTER ONLY)
Abs Immature Granulocytes: 0.02 10*3/uL (ref 0.00–0.07)
Basophils Absolute: 0.1 10*3/uL (ref 0.0–0.1)
Basophils Relative: 1 %
Eosinophils Absolute: 0.1 10*3/uL (ref 0.0–0.5)
Eosinophils Relative: 1 %
HCT: 36.7 % (ref 36.0–46.0)
Hemoglobin: 12.5 g/dL (ref 12.0–15.0)
Immature Granulocytes: 0 %
Lymphocytes Relative: 9 %
Lymphs Abs: 0.5 10*3/uL — ABNORMAL LOW (ref 0.7–4.0)
MCH: 32 pg (ref 26.0–34.0)
MCHC: 34.1 g/dL (ref 30.0–36.0)
MCV: 93.9 fL (ref 80.0–100.0)
Monocytes Absolute: 0.4 10*3/uL (ref 0.1–1.0)
Monocytes Relative: 8 %
Neutro Abs: 4.6 10*3/uL (ref 1.7–7.7)
Neutrophils Relative %: 81 %
Platelet Count: 142 10*3/uL — ABNORMAL LOW (ref 150–400)
RBC: 3.91 MIL/uL (ref 3.87–5.11)
RDW: 11.6 % (ref 11.5–15.5)
WBC Count: 5.6 10*3/uL (ref 4.0–10.5)
nRBC: 0 % (ref 0.0–0.2)

## 2022-03-07 LAB — CMP (CANCER CENTER ONLY)
ALT: 13 U/L (ref 0–44)
AST: 19 U/L (ref 15–41)
Albumin: 4.2 g/dL (ref 3.5–5.0)
Alkaline Phosphatase: 47 U/L (ref 38–126)
Anion gap: 5 (ref 5–15)
BUN: 13 mg/dL (ref 6–20)
CO2: 29 mmol/L (ref 22–32)
Calcium: 9.2 mg/dL (ref 8.9–10.3)
Chloride: 106 mmol/L (ref 98–111)
Creatinine: 0.79 mg/dL (ref 0.44–1.00)
GFR, Estimated: >60 mL/min (ref >60)
Glucose, Bld: 108 mg/dL — ABNORMAL HIGH (ref 70–99)
Potassium: 3.7 mmol/L (ref 3.5–5.1)
Sodium: 140 mmol/L (ref 135–145)
Total Bilirubin: 0.7 mg/dL (ref 0.3–1.2)
Total Protein: 6.4 g/dL — ABNORMAL LOW (ref 6.5–8.1)

## 2022-03-07 MED ORDER — TEMOZOLOMIDE 20 MG PO CAPS: 80.0000 mg | ORAL_CAPSULE | Freq: Every day | ORAL | 0 refills | Status: DC

## 2022-03-07 NOTE — Progress Notes (Signed)
[facility] [place] [address] [place], [place] [address] [phone]   Interval Evaluation  Date of Service: 03/07/22 Patient Name: Debbie Horton Patient MRN: [id number] Patient DOB: 06-18-1965 Provider: Ventura Sellers, MD  Identifying Statement:  Debbie Horton is a 57 y.o. female with left occipital glioblastoma    Oncologic History: Oncology History  Glioblastoma, IDH-wildtype ([place])  07/06/2021 Surgery   Left occipital craniotomy, resection with Dr. Reatha Armour; path is Glioblastoma, IDHwt    - 09/24/2021 Radiation Therapy   Completes IMRT[facility]TMZ at [facility]   10/25/2021 -  Chemotherapy   Initiates cycle #1 adjuvant Temodar through [place] team   11/23/2021 -  Chemotherapy   Patient is on Treatment Plan : BRAIN GLIOBLASTOMA Consolidation Temozolomide Days 1-5 q28 Days        Biomarkers:  MGMT Unknown.  IDH 1/2 Wild type.  EGFR Unknown  TERT Unknown   Interval History: Debbie Horton presents today for follow up, now having completed cycle #4 of (now) metronomic daily Temodar.  Continues to tolerate treatment well.  Constipation has been improved. Denies seizures.    H+P (07/22/21) Patient presented to medical attention last month with new onset headaches, blurry vision and episodes of confusion.  She obtained CNS imaging in the hospital which demonstrated an enhancing mass in the left occipital lobe.  She underwent craniotomy, resection with Dr. Reatha Armour; path demonstrated glioblastoma IDHwt.  Following surgery, she has tapered off steroids.  She feels close to her baseline, aside from right visual field impairment, not improved.  She is fully functional, independent, and otherwise healthy.  Medications: Current Outpatient Medications on File Prior to Visit  Medication Sig Dispense Refill   acetaminophen (TYLENOL) 500 MG tablet Take 1,000 mg by mouth every 8 (eight) hours as needed for headache.     estradiol (CLIMARA -  DOSED IN MG/24 HR) 0.05 mg/24hr patch Place 0.05 mg onto the skin every Wednesday.     ibuprofen (ADVIL) 200 MG tablet Take 400 mg by mouth every 8 (eight) hours as needed for headache.     levocetirizine (XYZAL) 5 MG tablet Take 5 mg by mouth daily as needed (seasonal allergies).     ondansetron (ZOFRAN) 8 MG tablet Take 1 tablet (8 mg total) by mouth every 8 (eight) hours as needed for nausea or vomiting. May take 30-60 minutes prior to Temodar administration if nausea/vomiting occurs as needed. 30 tablet 1   polyethylene glycol (MIRALAX / GLYCOLAX) 17 g packet Take 17 g by mouth daily as needed.     progesterone (PROMETRIUM) 200 MG capsule Take 200 mg by mouth at bedtime.     temozolomide (TEMODAR) 20 MG capsule TAKE 4 CAPSULES BY MOUTH AT  BEDTIME (MAY TAKE ON EMPTY  STOMACH TO DECREASE NAUSEA AND  VOMITING) 112 capsule 0   No current facility-administered medications on file prior to visit.    Allergies:  Allergies  Allergen Reactions   Shellfish-Derived Products Nausea Only   Latex Rash   Penicillins Rash   Phenergan [Promethazine] Palpitations   Sulfa Antibiotics Rash   Past Medical History: No past medical history on file. Past Surgical History:  Past Surgical History:  Procedure Laterality Date   APPLICATION OF CRANIAL NAVIGATION N/A 07/06/2021   Procedure: APPLICATION OF CRANIAL NAVIGATION;  Surgeon: Dawley, Theodoro Doing, DO;  Location: [facility];  Service: Neurosurgery;  Laterality: N/A;   CRANIOTOMY N/A 07/06/2021   Procedure: LEFT OCCIPITAL CRANIOTOMY FOR TUMOR EXCISION;  Surgeon:  Dawley, Theodoro Doing, DO;  Location: [facility];  Service: Neurosurgery;  Laterality: N/A;   Social History:  Social History   Socioeconomic History   Marital status: Married    Spouse name: Not on file   Number of children: Not on file   Years of education: Not on file   Highest education level: Not on file  Occupational History   Occupation: [employer]  Tobacco Use    Smoking status: Never   Smokeless tobacco: Never  Vaping Use   Vaping Use: Never used  Substance and Sexual Activity   Alcohol use: Yes    Comment: occasional   Drug use: Never   Sexual activity: Yes    Birth control/protection: Post-menopausal  Other Topics Concern   Not on file  Social History Narrative   2 children (12 yr son and 2 yr daughter).   Social Determinants of Health   Financial Resource Strain: Not on file  Food Insecurity: Not on file  Transportation Needs: Not on file  Physical Activity: Not on file  Stress: Not on file  Social Connections: Not on file  Intimate Partner Violence: Not on file   Family History: No family history on file.  Review of Systems: Constitutional: Doesn't report fevers, chills or abnormal weight loss Eyes: Doesn't report blurriness of vision Ears, nose, mouth, throat, and face: Doesn't report sore throat Respiratory: Doesn't report cough, dyspnea or wheezes Cardiovascular: Doesn't report palpitation, chest discomfort  Gastrointestinal:  Doesn't report nausea, constipation, diarrhea GU: Doesn't report incontinence Skin: Doesn't report skin rashes Neurological: Per HPI Musculoskeletal: Doesn't report joint pain Behavioral/Psych: Doesn't report anxiety  Physical Exam: Vitals:   03/07/22 1340  BP: 126/64  Pulse: 95  Resp: 20  Temp: 97.9 F (36.6 C)  SpO2: 100%     KPS: 80. General: Alert, cooperative, pleasant, in no acute distress Head: Normal EENT: No conjunctival injection or scleral icterus.  Lungs: Resp effort normal Cardiac: Regular rate Abdomen: Non-distended abdomen Skin: No rashes cyanosis or petechiae. Extremities: No clubbing or edema  Neurologic Exam: Mental Status: Awake, alert, attentive to examiner. Oriented to self and environment. Language is fluent with intact comprehension.  Cranial Nerves: Visual acuity is grossly normal. R hemianopia. Extra-ocular movements intact. No ptosis. Face is  symmetric Motor: Tone and bulk are normal. Power is full in both arms and legs. Reflexes are symmetric, no pathologic reflexes present.  Sensory: Intact to light touch Gait: Normal.   Labs: I have reviewed the data as listed    Component Value Date/Time   NA 141 02/03/2022 0933   K 3.8 02/03/2022 0933   CL 107 02/03/2022 0933   CO2 29 02/03/2022 0933   GLUCOSE 82 02/03/2022 0933   BUN 19 02/03/2022 0933   CREATININE 0.83 02/03/2022 0933   CALCIUM 9.0 02/03/2022 0933   PROT 6.5 02/03/2022 0933   ALBUMIN 4.1 02/03/2022 0933   AST 18 02/03/2022 0933   ALT 14 02/03/2022 0933   ALKPHOS 44 02/03/2022 0933   BILITOT 0.8 02/03/2022 0933   GFRNONAA >60 02/03/2022 0933   Lab Results  Component Value Date   WBC 5.6 03/07/2022   NEUTROABS 4.6 03/07/2022   HGB 12.5 03/07/2022   HCT 36.7 03/07/2022   MCV 93.9 03/07/2022   PLT 142 (L) 03/07/2022    Imaging:  [facility] Clinician Interpretation: I have personally reviewed the CNS images as listed.  My interpretation, in the context of the patient's clinical presentation, is stable disease  No  results found.   Assessment/Plan Glioblastoma, IDH-wildtype ([place])  Marshell Garfinkel Brabant is clinically stable today, now having completed cycle #4 daily metronomic Temodar.  MRI brain demonstrates stable findings.  Recommended continuing cycle #5 daily metronomic Temozolomide 50 mg/m2. The patient will have a complete blood count performed on days 14 and 28 of each cycle, and a comprehensive metabolic panel performed on day 28 of each cycle. Labs may need to be performed more often. Zofran will prescribed for home use for nausea/vomiting.   Chemotherapy should be held for the following:  ANC less than 1,000  Platelets less than 100,000  LFT or creatinine greater than 2x ULN  If clinical concerns/contraindications develop  We ask that [name] return to clinic in 1 months with MRI brain for review prior to cycle #6, or sooner as  needed.  All questions were answered. The patient knows to call the clinic with any problems, questions or concerns. No barriers to learning were detected.  The total time spent in the encounter was 30 minutes and more than 50% was on counseling and review of test results   Ventura Sellers, MD Medical Director of Neuro-Oncology [facility] [place] 03/07/22 1:56 PM

## 2022-03-08 ENCOUNTER — Telehealth: Payer: Self-pay | Admitting: Internal Medicine

## 2022-03-08 NOTE — Telephone Encounter (Signed)
Called patient per 1/22 los notes to schedule f/u. Patient scheduled and notified.

## 2022-03-28 ENCOUNTER — Other Ambulatory Visit: Payer: Self-pay | Admitting: Internal Medicine

## 2022-03-28 DIAGNOSIS — C719 Malignant neoplasm of brain, unspecified: Secondary | ICD-10-CM

## 2022-03-30 ENCOUNTER — Encounter: Payer: Self-pay | Admitting: Internal Medicine

## 2022-04-04 ENCOUNTER — Ambulatory Visit (HOSPITAL_COMMUNITY)
Admission: RE | Admit: 2022-04-04 | Discharge: 2022-04-04 | Disposition: A | Discharge status: Home / Self Care | Payer: 59 | Source: Ambulatory Visit | Attending: Internal Medicine | Admitting: Internal Medicine

## 2022-04-04 DIAGNOSIS — C719 Malignant neoplasm of brain, unspecified: Secondary | ICD-10-CM

## 2022-04-04 MED ORDER — GADOBUTROL 1 MMOL/ML IV SOLN
5.5000 mL | Freq: Once | INTRAVENOUS | Status: AC | PRN
  Administered 2022-04-04: 5.5 mL via INTRAVENOUS

## 2022-04-07 ENCOUNTER — Inpatient Hospital Stay: Payer: 59 | Attending: Internal Medicine

## 2022-04-07 ENCOUNTER — Inpatient Hospital Stay (HOSPITAL_BASED_OUTPATIENT_CLINIC_OR_DEPARTMENT_OTHER): Payer: 59 | Admitting: Internal Medicine

## 2022-04-07 VITALS — BP 101/68 | HR 96 | Temp 97.7°F | Resp 18 | Wt 125.4 lb

## 2022-04-07 DIAGNOSIS — C714 Malignant neoplasm of occipital lobe: Secondary | ICD-10-CM | POA: Diagnosis present

## 2022-04-07 DIAGNOSIS — C719 Malignant neoplasm of brain, unspecified: Secondary | ICD-10-CM

## 2022-04-07 DIAGNOSIS — Z79630 Long term (current) use of alkylating agent: Secondary | ICD-10-CM | POA: Diagnosis not present

## 2022-04-07 LAB — CMP (CANCER CENTER ONLY)
ALT: 12 U/L (ref 0–44)
AST: 16 U/L (ref 15–41)
Albumin: 4.2 g/dL (ref 3.5–5.0)
Alkaline Phosphatase: 44 U/L (ref 38–126)
Anion gap: 5 (ref 5–15)
BUN: 21 mg/dL — ABNORMAL HIGH (ref 6–20)
CO2: 29 mmol/L (ref 22–32)
Calcium: 8.7 mg/dL — ABNORMAL LOW (ref 8.9–10.3)
Chloride: 105 mmol/L (ref 98–111)
Creatinine: 0.77 mg/dL (ref 0.44–1.00)
GFR, Estimated: >60 mL/min (ref >60)
Glucose, Bld: 76 mg/dL (ref 70–99)
Potassium: 4.1 mmol/L (ref 3.5–5.1)
Sodium: 139 mmol/L (ref 135–145)
Total Bilirubin: 0.8 mg/dL (ref 0.3–1.2)
Total Protein: 6.5 g/dL (ref 6.5–8.1)

## 2022-04-07 LAB — CBC WITH DIFFERENTIAL (CANCER CENTER ONLY)
Abs Immature Granulocytes: 0.01 10*3/uL (ref 0.00–0.07)
Basophils Absolute: 0.1 10*3/uL (ref 0.0–0.1)
Basophils Relative: 1 %
Eosinophils Absolute: 0.1 10*3/uL (ref 0.0–0.5)
Eosinophils Relative: 3 %
HCT: 36.2 % (ref 36.0–46.0)
Hemoglobin: 12.8 g/dL (ref 12.0–15.0)
Immature Granulocytes: 0 %
Lymphocytes Relative: 15 %
Lymphs Abs: 0.5 10*3/uL — ABNORMAL LOW (ref 0.7–4.0)
MCH: 33 pg (ref 26.0–34.0)
MCHC: 35.4 g/dL (ref 30.0–36.0)
MCV: 93.3 fL (ref 80.0–100.0)
Monocytes Absolute: 0.5 10*3/uL (ref 0.1–1.0)
Monocytes Relative: 14 %
Neutro Abs: 2.3 10*3/uL (ref 1.7–7.7)
Neutrophils Relative %: 67 %
Platelet Count: 137 10*3/uL — ABNORMAL LOW (ref 150–400)
RBC: 3.88 MIL/uL (ref 3.87–5.11)
RDW: 11.3 % — ABNORMAL LOW (ref 11.5–15.5)
WBC Count: 3.5 10*3/uL — ABNORMAL LOW (ref 4.0–10.5)
nRBC: 0 % (ref 0.0–0.2)

## 2022-04-07 MED ORDER — TEMOZOLOMIDE 20 MG PO CAPS: 80.0000 mg | ORAL_CAPSULE | Freq: Every day | ORAL | 0 refills | Status: DC

## 2022-04-07 NOTE — Progress Notes (Signed)
[facility] at [place], [place] [address] [phone]   Interval Evaluation  Date of Service: 04/07/22 Patient Name: Debbie Horton Patient MRN: [id number] Patient DOB: [birth year] Provider: Ventura Sellers, MD  Identifying Statement:  Resa Anliker is a 57 y.o. female with left occipital glioblastoma    Oncologic History: Oncology History  Glioblastoma, IDH-wildtype ([place])  07/06/2021 Surgery   Left occipital craniotomy, resection with Dr. Reatha Armour; path is Glioblastoma, IDHwt    - 09/24/2021 Radiation Therapy   Completes IMRT+TMZ at [facility]   10/25/2021 -  Chemotherapy   Initiates cycle #1 adjuvant Temodar through [place] team   11/23/2021 -  Chemotherapy   Patient is on Treatment Plan : BRAIN GLIOBLASTOMA Consolidation Temozolomide Days 1-5 q28 Days        Biomarkers:  MGMT Unknown.  IDH 1/2 Wild type.  EGFR Unknown  TERT Unknown   Interval History: Aerielle Weinhold presents today for follow up, now having completed cycle #5 of (now) metronomic daily Temodar.  Continues to tolerate treatment well.  No longer needing  to dose the zofran. Denies seizures.    H+P (07/22/21) Patient presented to medical attention last month with new onset headaches, blurry vision and episodes of confusion.  She obtained CNS imaging in the hospital which demonstrated an enhancing mass in the left occipital lobe.  She underwent craniotomy, resection with Dr. Reatha Armour; path demonstrated glioblastoma IDHwt.  Following surgery, she has tapered off steroids.  She feels close to her baseline, aside from right visual field impairment, not improved.  She is fully functional, independent, and otherwise healthy.  Medications: Current Outpatient Medications on File Prior to Visit  Medication Sig Dispense Refill   acetaminophen (TYLENOL) 500 MG tablet Take 1,000 mg by mouth every 8 (eight) hours as needed for headache.     estradiol  (CLIMARA - DOSED IN MG/24 HR) 0.05 mg/24hr patch Place 0.05 mg onto the skin every Wednesday.     ibuprofen (ADVIL) 200 MG tablet Take 400 mg by mouth every 8 (eight) hours as needed for headache.     levocetirizine (XYZAL) 5 MG tablet Take 5 mg by mouth daily as needed (seasonal allergies).     ondansetron (ZOFRAN) 8 MG tablet Take 1 tablet (8 mg total) by mouth every 8 (eight) hours as needed for nausea or vomiting. May take 30-60 minutes prior to Temodar administration if nausea/vomiting occurs as needed. 30 tablet 1   polyethylene glycol (MIRALAX / GLYCOLAX) 17 g packet Take 17 g by mouth daily as needed.     progesterone (PROMETRIUM) 200 MG capsule Take 200 mg by mouth at bedtime.     temozolomide (TEMODAR) 20 MG capsule TAKE 4 CAPSULES BY MOUTH AT  BEDTIME (MAY TAKE ON EMPTY  STOMACH TO DECREASE NAUSEA AND  VOMITING) 112 capsule 0   temozolomide (TEMODAR) 20 MG capsule TAKE 4 CAPSULES BY MOUTH DAILY 112 capsule 0   No current facility-administered medications on file prior to visit.    Allergies:  Allergies  Allergen Reactions   Shellfish-Derived Products Nausea Only   Latex Rash   Penicillins Rash   Phenergan [Promethazine] Palpitations   Sulfa Antibiotics Rash   Past Medical History: No past medical history on file. Past Surgical History:  Past Surgical History:  Procedure Laterality Date   APPLICATION OF CRANIAL NAVIGATION N/A 07/06/2021   Procedure: APPLICATION OF CRANIAL NAVIGATION;  Surgeon: Karsten Ro, DO;  Location: [facility];  Service:  Neurosurgery;  Laterality: N/A;   CRANIOTOMY N/A 07/06/2021   Procedure: LEFT OCCIPITAL CRANIOTOMY FOR TUMOR EXCISION;  Surgeon: Dawley, Theodoro Doing, DO;  Location: [facility];  Service: Neurosurgery;  Laterality: N/A;   Social History:  Social History   Socioeconomic History   Marital status: Married    Spouse name: Not on file   Number of children: Not on file   Years of education: Not on file   Highest education level: Not on file   Occupational History   Occupation: [employer]  Tobacco Use   Smoking status: Never   Smokeless tobacco: Never  Vaping Use   Vaping Use: Never used  Substance and Sexual Activity   Alcohol use: Yes    Comment: occasional   Drug use: Never   Sexual activity: Yes    Birth control/protection: Post-menopausal  Other Topics Concern   Not on file  Social History Narrative   2 children (67 yr son and 1 yr daughter).   Social Determinants of Health   Financial Resource Strain: Not on file  Food Insecurity: Not on file  Transportation Needs: Not on file  Physical Activity: Not on file  Stress: Not on file  Social Connections: Not on file  Intimate Partner Violence: Not on file   Family History: No family history on file.  Review of Systems: Constitutional: Doesn't report fevers, chills or abnormal weight loss Eyes: Doesn't report blurriness of vision Ears, nose, mouth, throat, and face: Doesn't report sore throat Respiratory: Doesn't report cough, dyspnea or wheezes Cardiovascular: Doesn't report palpitation, chest discomfort  Gastrointestinal:  Doesn't report nausea, constipation, diarrhea GU: Doesn't report incontinence Skin: Doesn't report skin rashes Neurological: Per HPI Musculoskeletal: Doesn't report joint pain Behavioral/Psych: Doesn't report anxiety  Physical Exam: Vitals:   04/07/22 0910  BP: 101/68  Pulse: 96  Resp: 18  Temp: 97.7 F (36.5 C)  SpO2: 100%   KPS: 80. General: Alert, cooperative, pleasant, in no acute distress Head: Normal EENT: No conjunctival injection or scleral icterus.  Lungs: Resp effort normal Cardiac: Regular rate Abdomen: Non-distended abdomen Skin: No rashes cyanosis or petechiae. Extremities: No clubbing or edema  Neurologic Exam: Mental Status: Awake, alert, attentive to examiner. Oriented to self and environment. Language is fluent with intact comprehension.  Cranial Nerves: Visual acuity  is grossly normal. R hemianopia. Extra-ocular movements intact. No ptosis. Face is symmetric Motor: Tone and bulk are normal. Power is full in both arms and legs. Reflexes are symmetric, no pathologic reflexes present.  Sensory: Intact to light touch Gait: Normal.   Labs: I have reviewed the data as listed    Component Value Date/Time   NA 140 03/07/2022 1329   K 3.7 03/07/2022 1329   CL 106 03/07/2022 1329   CO2 29 03/07/2022 1329   GLUCOSE 108 (H) 03/07/2022 1329   BUN 13 03/07/2022 1329   CREATININE 0.79 03/07/2022 1329   CALCIUM 9.2 03/07/2022 1329   PROT 6.4 (L) 03/07/2022 1329   ALBUMIN 4.2 03/07/2022 1329   AST 19 03/07/2022 1329   ALT 13 03/07/2022 1329   ALKPHOS 47 03/07/2022 1329   BILITOT 0.7 03/07/2022 1329   GFRNONAA >60 03/07/2022 1329   Lab Results  Component Value Date   WBC 5.6 03/07/2022   NEUTROABS 4.6 03/07/2022   HGB 12.5 03/07/2022   HCT 36.7 03/07/2022   MCV 93.9 03/07/2022   PLT 142 (L) 03/07/2022    Imaging:  [facility] Clinician Interpretation: I have personally reviewed the  CNS images as listed.  My interpretation, in the context of the patient's clinical presentation, is stable disease  MR BRAIN W WO CONTRAST  Result Date: 04/04/2022 CLINICAL DATA:  Brain/CNS neoplasm, assess treatment response EXAM: MRI HEAD WITHOUT AND WITH CONTRAST TECHNIQUE: Multiplanar, multiecho pulse sequences of the brain and surrounding structures were obtained without and with intravenous contrast. CONTRAST:  5.7m GADAVIST GADOBUTROL 1 MMOL/ML IV SOLN COMPARISON:  MRI 01/31/2022. FINDINGS: Brain: Previous left occipital craniotomy for tumor resection. Slightly more nodular enhancement along the inferior margin of the resection cavity with otherwise similar mild linear enhancement. Slight retraction of the resection cavity. Surrounding T2/FLAIR hyperintensities unchanged. No evidence of acute infarct, acute hemorrhage, midline shift or hydrocephalus. Prior hemorrhage at the  site of the resection cavity. Vascular: Major arterial flow voids are maintained at the skull base. Skull and upper cervical spine: Normal marrow signal. Sinuses/Orbits: Negative. Other: No mastoid effusions. IMPRESSION: Slightly more nodular enhancement along the inferior margin of the resection cavity with otherwise similar mild linear enhancement. This could potentially relate to changes in the resection cavity, but warrants continued attention on follow-up to exclude progression. Electronically Signed   By: FMargaretha SheffieldM.D.   On: 04/04/2022 14:32     Assessment/Plan Glioblastoma, IDH-wildtype ([place] - Plan: temozolomide (TEMODAR) 20 MG capsule  BLuciano Cutteris clinically stable today, now having completed cycle #5 daily metronomic Temodar.  MRI brain demonstrates stable findings; nodularity adjacent to resection cavity is similarly enhancing and may be c/w cavity collapse.  Recommended continuing cycle #6 daily metronomic Temozolomide 50 mg/m2. The patient will have a complete blood count performed on days 14 and 28 of each cycle, and a comprehensive metabolic panel performed on day 28 of each cycle. Labs may need to be performed more often. Zofran will prescribed for home use for nausea/vomiting.   Chemotherapy should be held for the following:  ANC less than 1,000  Platelets less than 100,000  LFT or creatinine greater than 2x ULN  If clinical concerns/contraindications develop  We ask that BLuciano Cutterreturn to clinic in 1 months with labs for review prior to cycle #7, or sooner as needed.  All questions were answered. The patient knows to call the clinic with any problems, questions or concerns. No barriers to learning were detected.  The total time spent in the encounter was 30 minutes and more than 50% was on counseling and review of test results   ZVentura Sellers MD Medical Director of Neuro-Oncology [facility] [place] [place]02/22/24  9:08 AM

## 2022-04-09 ENCOUNTER — Other Ambulatory Visit: Payer: Self-pay

## 2022-04-11 ENCOUNTER — Inpatient Hospital Stay: Payer: 59

## 2022-04-18 ENCOUNTER — Other Ambulatory Visit: Payer: Self-pay | Admitting: *Deleted

## 2022-04-18 ENCOUNTER — Encounter: Payer: Self-pay | Admitting: Internal Medicine

## 2022-04-18 DIAGNOSIS — C719 Malignant neoplasm of brain, unspecified: Secondary | ICD-10-CM

## 2022-04-20 ENCOUNTER — Other Ambulatory Visit: Payer: Self-pay

## 2022-04-26 ENCOUNTER — Other Ambulatory Visit: Payer: Self-pay | Admitting: Radiation Therapy

## 2022-04-28 ENCOUNTER — Other Ambulatory Visit: Payer: Self-pay | Admitting: Internal Medicine

## 2022-04-28 DIAGNOSIS — C719 Malignant neoplasm of brain, unspecified: Secondary | ICD-10-CM

## 2022-05-05 ENCOUNTER — Inpatient Hospital Stay (HOSPITAL_BASED_OUTPATIENT_CLINIC_OR_DEPARTMENT_OTHER): Payer: 59 | Admitting: Internal Medicine

## 2022-05-05 ENCOUNTER — Inpatient Hospital Stay: Payer: 59 | Attending: Internal Medicine

## 2022-05-05 VITALS — BP 99/63 | HR 85 | Temp 97.9°F | Resp 17 | Ht 64.5 in | Wt 124.4 lb

## 2022-05-05 DIAGNOSIS — C714 Malignant neoplasm of occipital lobe: Secondary | ICD-10-CM | POA: Insufficient documentation

## 2022-05-05 DIAGNOSIS — C719 Malignant neoplasm of brain, unspecified: Secondary | ICD-10-CM

## 2022-05-05 LAB — CBC WITH DIFFERENTIAL (CANCER CENTER ONLY)
Abs Immature Granulocytes: 0.02 10*3/uL (ref 0.00–0.07)
Basophils Absolute: 0.1 10*3/uL (ref 0.0–0.1)
Basophils Relative: 1 %
Eosinophils Absolute: 0.2 10*3/uL (ref 0.0–0.5)
Eosinophils Relative: 5 %
HCT: 40 % (ref 36.0–46.0)
Hemoglobin: 13.6 g/dL (ref 12.0–15.0)
Immature Granulocytes: 1 %
Lymphocytes Relative: 14 %
Lymphs Abs: 0.6 10*3/uL — ABNORMAL LOW (ref 0.7–4.0)
MCH: 31.9 pg (ref 26.0–34.0)
MCHC: 34 g/dL (ref 30.0–36.0)
MCV: 93.9 fL (ref 80.0–100.0)
Monocytes Absolute: 0.5 10*3/uL (ref 0.1–1.0)
Monocytes Relative: 12 %
Neutro Abs: 2.7 10*3/uL (ref 1.7–7.7)
Neutrophils Relative %: 67 %
Platelet Count: 138 10*3/uL — ABNORMAL LOW (ref 150–400)
RBC: 4.26 MIL/uL (ref 3.87–5.11)
RDW: 11.5 % (ref 11.5–15.5)
WBC Count: 4 10*3/uL (ref 4.0–10.5)
nRBC: 0 % (ref 0.0–0.2)

## 2022-05-05 LAB — CMP (CANCER CENTER ONLY)
ALT: 12 U/L (ref 0–44)
AST: 16 U/L (ref 15–41)
Albumin: 4.5 g/dL (ref 3.5–5.0)
Alkaline Phosphatase: 48 U/L (ref 38–126)
Anion gap: 5 (ref 5–15)
BUN: 20 mg/dL (ref 6–20)
CO2: 30 mmol/L (ref 22–32)
Calcium: 9.4 mg/dL (ref 8.9–10.3)
Chloride: 106 mmol/L (ref 98–111)
Creatinine: 0.85 mg/dL (ref 0.44–1.00)
GFR, Estimated: >60 mL/min (ref >60)
Glucose, Bld: 72 mg/dL (ref 70–99)
Potassium: 4.3 mmol/L (ref 3.5–5.1)
Sodium: 141 mmol/L (ref 135–145)
Total Bilirubin: 1 mg/dL (ref 0.3–1.2)
Total Protein: 6.8 g/dL (ref 6.5–8.1)

## 2022-05-05 NOTE — Progress Notes (Signed)
[facility] at [place], [place] [address] [phone]   Interval Evaluation  Date of Service: 05/05/22 Patient Name: Debbie Horton Patient MRN: [id number] Patient DOB: [birth year] Provider: Ventura Sellers, MD  Identifying Statement:  Debbie Horton is a 57 y.o. female with left occipital glioblastoma    Oncologic History: Oncology History  Glioblastoma, IDH-wildtype ([place])  07/06/2021 Surgery   Left occipital craniotomy, resection with Dr. Reatha Armour; path is Glioblastoma, IDHwt    - 09/24/2021 Radiation Therapy   Completes IMRT+TMZ at [facility]   10/25/2021 -  Chemotherapy   Initiates cycle #1 adjuvant Temodar through [facility] team   11/23/2021 -  Chemotherapy   Patient is on Treatment Plan : BRAIN GLIOBLASTOMA Consolidation Temozolomide Days 1-5 q28 Days        Biomarkers:  MGMT Unknown.  IDH 1/2 Wild type.  EGFR Unknown  TERT Unknown   Interval History: Debbie Horton presents today for follow up, now having completed cycle #6 of metronomic daily Temodar.  Continues to tolerate treatment well.  No new or progressive changes.  Denies seizures.    H+P (07/22/21) Patient presented to medical attention last month with new onset headaches, blurry vision and episodes of confusion.  She obtained CNS imaging in the hospital which demonstrated an enhancing mass in the left occipital lobe.  She underwent craniotomy, resection with Dr. Reatha Armour; path demonstrated glioblastoma IDHwt.  Following surgery, she has tapered off steroids.  She feels close to her baseline, aside from right visual field impairment, not improved.  She is fully functional, independent, and otherwise healthy.  Medications: Current Outpatient Medications on File Prior to Visit  Medication Sig Dispense Refill   acetaminophen (TYLENOL) 500 MG tablet Take 1,000 mg by mouth every 8 (eight) hours as needed for headache.     estradiol (CLIMARA - DOSED  IN MG/24 HR) 0.05 mg/24hr patch Place 0.05 mg onto the skin every Wednesday.     ibuprofen (ADVIL) 200 MG tablet Take 400 mg by mouth every 8 (eight) hours as needed for headache.     levocetirizine (XYZAL) 5 MG tablet Take 5 mg by mouth daily as needed (seasonal allergies).     ondansetron (ZOFRAN) 8 MG tablet Take 1 tablet (8 mg total) by mouth every 8 (eight) hours as needed for nausea or vomiting. May take 30-60 minutes prior to Temodar administration if nausea/vomiting occurs as needed. 30 tablet 1   polyethylene glycol (MIRALAX / GLYCOLAX) 17 g packet Take 17 g by mouth daily as needed.     progesterone (PROMETRIUM) 200 MG capsule Take 200 mg by mouth at bedtime.     temozolomide (TEMODAR) 20 MG capsule TAKE 4 CAPSULES BY MOUTH DAILY 112 capsule 0   No current facility-administered medications on file prior to visit.    Allergies:  Allergies  Allergen Reactions   Shellfish-Derived Products Nausea Only   Latex Rash   Penicillins Rash   Phenergan [Promethazine] Palpitations   Sulfa Antibiotics Rash   Past Medical History: No past medical history on file. Past Surgical History:  Past Surgical History:  Procedure Laterality Date   APPLICATION OF CRANIAL NAVIGATION N/A 07/06/2021   Procedure: APPLICATION OF CRANIAL NAVIGATION;  Surgeon: Dawley, Theodoro Doing, DO;  Location: [facility];  Service: Neurosurgery;  Laterality: N/A;   CRANIOTOMY N/A 07/06/2021   Procedure: LEFT OCCIPITAL CRANIOTOMY FOR TUMOR EXCISION;  Surgeon: Dawley, Theodoro Doing, DO;  Location: [facility];  Service: Neurosurgery;  Laterality:  N/A;   Social History:  Social History   Socioeconomic History   Marital status: Married    Spouse name: Not on file   Number of children: Not on file   Years of education: Not on file   Highest education level: Not on file  Occupational History   Occupation: [employer]  Tobacco Use   Smoking status: Never   Smokeless tobacco: Never  Vaping Use   Vaping Use:  Never used  Substance and Sexual Activity   Alcohol use: Yes    Comment: occasional   Drug use: Never   Sexual activity: Yes    Birth control/protection: Post-menopausal  Other Topics Concern   Not on file  Social History Narrative   2 children (3 yr son and 19 yr daughter).   Social Determinants of Health   Financial Resource Strain: Not on file  Food Insecurity: Not on file  Transportation Needs: Not on file  Physical Activity: Not on file  Stress: Not on file  Social Connections: Not on file  Intimate Partner Violence: Not on file   Family History: No family history on file.  Review of Systems: Constitutional: Doesn't report fevers, chills or abnormal weight loss Eyes: Doesn't report blurriness of vision Ears, nose, mouth, throat, and face: Doesn't report sore throat Respiratory: Doesn't report cough, dyspnea or wheezes Cardiovascular: Doesn't report palpitation, chest discomfort  Gastrointestinal:  Doesn't report nausea, constipation, diarrhea GU: Doesn't report incontinence Skin: Doesn't report skin rashes Neurological: Per HPI Musculoskeletal: Doesn't report joint pain Behavioral/Psych: Doesn't report anxiety  Physical Exam: Vitals:   05/05/22 0923  BP: 99/63  Pulse: 85  Resp: 17  Temp: 97.9 F (36.6 C)  SpO2: 100%   KPS: 80. General: Alert, cooperative, pleasant, in no acute distress Head: Normal EENT: No conjunctival injection or scleral icterus.  Lungs: Resp effort normal Cardiac: Regular rate Abdomen: Non-distended abdomen Skin: No rashes cyanosis or petechiae. Extremities: No clubbing or edema  Neurologic Exam: Mental Status: Awake, alert, attentive to examiner. Oriented to self and environment. Language is fluent with intact comprehension.  Cranial Nerves: Visual acuity is grossly normal. R hemianopia. Extra-ocular movements intact. No ptosis. Face is symmetric Motor: Tone and bulk are normal. Power is full in both arms and legs. Reflexes are  symmetric, no pathologic reflexes present.  Sensory: Intact to light touch Gait: Normal.   Labs: I have reviewed the data as listed    Component Value Date/Time   NA 139 04/07/2022 0900   K 4.1 04/07/2022 0900   CL 105 04/07/2022 0900   CO2 29 04/07/2022 0900   GLUCOSE 76 04/07/2022 0900   BUN 21 (H) 04/07/2022 0900   CREATININE 0.77 04/07/2022 0900   CALCIUM 8.7 (L) 04/07/2022 0900   PROT 6.5 04/07/2022 0900   ALBUMIN 4.2 04/07/2022 0900   AST 16 04/07/2022 0900   ALT 12 04/07/2022 0900   ALKPHOS 44 04/07/2022 0900   BILITOT 0.8 04/07/2022 0900   GFRNONAA >60 04/07/2022 0900   Lab Results  Component Value Date   WBC 4.0 05/05/2022   NEUTROABS 2.7 05/05/2022   HGB 13.6 05/05/2022   HCT 40.0 05/05/2022   MCV 93.9 05/05/2022   PLT 138 (L) 05/05/2022     Assessment/Plan Glioblastoma, IDH-wildtype ([place])  [name] is clinically stable today, now having completed cycle #6 daily metronomic Temodar.  No new or progressive changes.  Recommended continuing cycle #7 daily metronomic Temozolomide 50 mg/m2. The patient will have a complete  blood count performed on days 14 and 28 of each cycle, and a comprehensive metabolic panel performed on day 28 of each cycle. Labs may need to be performed more often. Zofran will prescribed for home use for nausea/vomiting.   Chemotherapy should be held for the following:  ANC less than 1,000  Platelets less than 100,000  LFT or creatinine greater than 2x ULN  If clinical concerns/contraindications develop  We ask that [name] return to clinic in 1 months with MRI  brain for review prior to cycle #8, or sooner as needed.  All questions were answered. The patient knows to call the clinic with any problems, questions or concerns. No barriers to learning were detected.  The total time spent in the encounter was 30 minutes and more than 50% was on counseling and review of test results   Ventura Sellers,  MD Medical Director of Neuro-Oncology [facility] at [place] 05/05/22 9:27 AM

## 2022-05-17 ENCOUNTER — Other Ambulatory Visit: Payer: Self-pay

## 2022-05-19 ENCOUNTER — Other Ambulatory Visit: Payer: Self-pay | Admitting: Internal Medicine

## 2022-05-19 ENCOUNTER — Other Ambulatory Visit: Payer: Self-pay

## 2022-05-19 DIAGNOSIS — C719 Malignant neoplasm of brain, unspecified: Secondary | ICD-10-CM

## 2022-06-03 ENCOUNTER — Ambulatory Visit (HOSPITAL_COMMUNITY)
Admission: RE | Admit: 2022-06-03 | Discharge: 2022-06-03 | Disposition: A | Discharge status: Home / Self Care | Payer: 59 | Source: Ambulatory Visit | Attending: Internal Medicine | Admitting: Internal Medicine

## 2022-06-03 DIAGNOSIS — C719 Malignant neoplasm of brain, unspecified: Secondary | ICD-10-CM

## 2022-06-03 MED ORDER — GADOBUTROL 1 MMOL/ML IV SOLN
6.0000 mL | Freq: Once | INTRAVENOUS | Status: AC | PRN
  Administered 2022-06-03: 6 mL via INTRAVENOUS

## 2022-06-06 ENCOUNTER — Inpatient Hospital Stay: Payer: 59

## 2022-06-09 ENCOUNTER — Ambulatory Visit: Payer: 59 | Admitting: Internal Medicine

## 2022-06-09 ENCOUNTER — Inpatient Hospital Stay: Payer: 59 | Attending: Internal Medicine | Admitting: Internal Medicine

## 2022-06-09 ENCOUNTER — Other Ambulatory Visit: Payer: 59

## 2022-06-09 ENCOUNTER — Other Ambulatory Visit: Payer: Self-pay

## 2022-06-09 ENCOUNTER — Other Ambulatory Visit: Payer: Self-pay | Admitting: Internal Medicine

## 2022-06-09 ENCOUNTER — Inpatient Hospital Stay: Payer: 59

## 2022-06-09 VITALS — BP 93/63 | HR 71 | Temp 98.6°F | Resp 20 | Wt 124.5 lb

## 2022-06-09 DIAGNOSIS — C719 Malignant neoplasm of brain, unspecified: Secondary | ICD-10-CM

## 2022-06-09 DIAGNOSIS — C714 Malignant neoplasm of occipital lobe: Secondary | ICD-10-CM | POA: Diagnosis present

## 2022-06-09 LAB — CBC WITH DIFFERENTIAL (CANCER CENTER ONLY)
Abs Immature Granulocytes: 0.01 10*3/uL (ref 0.00–0.07)
Basophils Absolute: 0 10*3/uL (ref 0.0–0.1)
Basophils Relative: 1 %
Eosinophils Absolute: 0.2 10*3/uL (ref 0.0–0.5)
Eosinophils Relative: 5 %
HCT: 38.4 % (ref 36.0–46.0)
Hemoglobin: 13 g/dL (ref 12.0–15.0)
Immature Granulocytes: 0 %
Lymphocytes Relative: 15 %
Lymphs Abs: 0.5 10*3/uL — ABNORMAL LOW (ref 0.7–4.0)
MCH: 32.1 pg (ref 26.0–34.0)
MCHC: 33.9 g/dL (ref 30.0–36.0)
MCV: 94.8 fL (ref 80.0–100.0)
Monocytes Absolute: 0.4 10*3/uL (ref 0.1–1.0)
Monocytes Relative: 11 %
Neutro Abs: 2.4 10*3/uL (ref 1.7–7.7)
Neutrophils Relative %: 68 %
Platelet Count: 142 10*3/uL — ABNORMAL LOW (ref 150–400)
RBC: 4.05 MIL/uL (ref 3.87–5.11)
RDW: 11.6 % (ref 11.5–15.5)
WBC Count: 3.5 10*3/uL — ABNORMAL LOW (ref 4.0–10.5)
nRBC: 0 % (ref 0.0–0.2)

## 2022-06-09 LAB — CMP (CANCER CENTER ONLY)
ALT: 12 U/L (ref 0–44)
AST: 17 U/L (ref 15–41)
Albumin: 4.3 g/dL (ref 3.5–5.0)
Alkaline Phosphatase: 44 U/L (ref 38–126)
Anion gap: 5 (ref 5–15)
BUN: 18 mg/dL (ref 6–20)
CO2: 30 mmol/L (ref 22–32)
Calcium: 9.2 mg/dL (ref 8.9–10.3)
Chloride: 105 mmol/L (ref 98–111)
Creatinine: 0.85 mg/dL (ref 0.44–1.00)
GFR, Estimated: >60 mL/min (ref >60)
Glucose, Bld: 76 mg/dL (ref 70–99)
Potassium: 4.1 mmol/L (ref 3.5–5.1)
Sodium: 140 mmol/L (ref 135–145)
Total Bilirubin: 1 mg/dL (ref 0.3–1.2)
Total Protein: 6.6 g/dL (ref 6.5–8.1)

## 2022-06-09 NOTE — Progress Notes (Signed)
[facility] at [facility] [address]  [place], [place] [address] [phone]   Interval Evaluation  Date of Service: 06/09/22 Patient Name: Debbie Horton Patient MRN: [id number] Patient DOB: 1965-04-05 Provider: Henreitta Leber, MD  Identifying Statement:  Debbie Horton is a 57 y.o. female with left occipital glioblastoma    Oncologic History: Oncology History  Glioblastoma, IDH-wildtype  07/06/2021 Surgery   Left occipital craniotomy, resection with Dr. Jake Samples; path is Glioblastoma, IDHwt    - 09/24/2021 Radiation Therapy   Completes IMRT+TMZ at [facility]   10/25/2021 -  Chemotherapy   Initiates cycle #1 adjuvant Temodar through [facility] team   11/23/2021 -  Chemotherapy   Patient is on Treatment Plan : BRAIN GLIOBLASTOMA Consolidation Temozolomide Days 1-5 q28 Days        Biomarkers:  MGMT Unknown.  IDH 1/2 Wild type.  EGFR Unknown  TERT Unknown   Interval History: Debbie Horton presents today for follow up, now having completed cycle #7 of metronomic daily Temodar.  Continues to tolerate treatment well.  No new or progressive changes.  Denies seizures.    H+P (07/22/21) Patient presented to medical attention last month with new onset headaches, blurry vision and episodes of confusion.  She obtained CNS imaging in the hospital which demonstrated an enhancing mass in the left occipital lobe.  She underwent craniotomy, resection with Dr. Jake Samples; path demonstrated glioblastoma IDHwt.  Following surgery, she has tapered off steroids.  She feels close to her baseline, aside from right visual field impairment, not improved.  She is fully functional, independent, and otherwise healthy.  Medications: Current Outpatient Medications on File Prior to Visit  Medication Sig Dispense Refill   acetaminophen (TYLENOL) 500 MG tablet Take 1,000 mg by mouth every 8 (eight) hours as needed for headache.     estradiol (CLIMARA - DOSED IN  MG/24 HR) 0.05 mg/24hr patch Place 0.05 mg onto the skin every Wednesday.     ibuprofen (ADVIL) 200 MG tablet Take 400 mg by mouth every 8 (eight) hours as needed for headache.     levocetirizine (XYZAL) 5 MG tablet Take 5 mg by mouth daily as needed (seasonal allergies).     ondansetron (ZOFRAN) 8 MG tablet Take 1 tablet (8 mg total) by mouth every 8 (eight) hours as needed for nausea or vomiting. May take 30-60 minutes prior to Temodar administration if nausea/vomiting occurs as needed. 30 tablet 1   polyethylene glycol (MIRALAX / GLYCOLAX) 17 g packet Take 17 g by mouth daily as needed.     progesterone (PROMETRIUM) 200 MG capsule Take 200 mg by mouth at bedtime.     temozolomide (TEMODAR) 20 MG capsule TAKE 4 CAPSULES BY MOUTH DAILY 112 capsule 0   No current facility-administered medications on file prior to visit.    Allergies:  Allergies  Allergen Reactions   Shellfish-Derived Products Nausea Only   Latex Rash   Penicillins Rash   Phenergan [Promethazine] Palpitations   Sulfa Antibiotics Rash   Past Medical History: No past medical history on file. Past Surgical History:  Past Surgical History:  Procedure Laterality Date   APPLICATION OF CRANIAL NAVIGATION N/A 07/06/2021   Procedure: APPLICATION OF CRANIAL NAVIGATION;  Surgeon: Dawley, Alan Mulder, DO;  Location[facility];  Service: Neurosurgery;  Laterality: N/A;   CRANIOTOMY N/A 07/06/2021   Procedure: LEFT OCCIPITAL CRANIOTOMY FOR TUMOR EXCISION;  Surgeon: Dawley, Alan Mulder, DO;  Location: [facility];  Service: Neurosurgery;  Laterality: N/A;  Social History:  Social History   Socioeconomic History   Marital status: Married    Spouse name: Not on file   Number of children: Not on file   Years of education: Not on file   Highest education level: Not on file  Occupational History   Occupation: [employer]  Tobacco Use   Smoking status: Never   Smokeless tobacco: Never  Vaping Use   Vaping Use: Never  used  Substance and Sexual Activity   Alcohol use: Yes    Comment: occasional   Drug use: Never   Sexual activity: Yes    Birth control/protection: Post-menopausal  Other Topics Concern   Not on file  Social History Narrative   2 children (15 yr son and 57 yr daughter).   Social Determinants of Health   Financial Resource Strain: Not on file  Food Insecurity: Not on file  Transportation Needs: Not on file  Physical Activity: Not on file  Stress: Not on file  Social Connections: Not on file  Intimate Partner Violence: Not on file   Family History: No family history on file.  Review of Systems: Constitutional: Doesn't report fevers, chills or abnormal weight loss Eyes: Doesn't report blurriness of vision Ears, nose, mouth, throat, and face: Doesn't report sore throat Respiratory: Doesn't report cough, dyspnea or wheezes Cardiovascular: Doesn't report palpitation, chest discomfort  Gastrointestinal:  Doesn't report nausea, constipation, diarrhea GU: Doesn't report incontinence Skin: Doesn't report skin rashes Neurological: Per HPI Musculoskeletal: Doesn't report joint pain Behavioral/Psych: Doesn't report anxiety  Physical Exam: Vitals:   06/09/22 1010  BP: 93/63  Pulse: 71  Resp: 20  Temp: 98.6 F (37 C)  SpO2: 100%    KPS: 80. General: Alert, cooperative, pleasant, in no acute distress Head: Normal EENT: No conjunctival injection or scleral icterus.  Lungs: Resp effort normal Cardiac: Regular rate Abdomen: Non-distended abdomen Skin: No rashes cyanosis or petechiae. Extremities: No clubbing or edema  Neurologic Exam: Mental Status: Awake, alert, attentive to examiner. Oriented to self and environment. Language is fluent with intact comprehension.  Cranial Nerves: Visual acuity is grossly normal. R hemianopia. Extra-ocular movements intact. No ptosis. Face is symmetric Motor: Tone and bulk are normal. Power is full in both arms and legs. Reflexes are  symmetric, no pathologic reflexes present.  Sensory: Intact to light touch Gait: Normal.   Labs: I have reviewed the data as listed    Component Value Date/Time   NA 141 05/05/2022 0909   K 4.3 05/05/2022 0909   CL 106 05/05/2022 0909   CO2 30 05/05/2022 0909   GLUCOSE 72 05/05/2022 0909   BUN 20 05/05/2022 0909   CREATININE 0.85 05/05/2022 0909   CALCIUM 9.4 05/05/2022 0909   PROT 6.8 05/05/2022 0909   ALBUMIN 4.5 05/05/2022 0909   AST 16 05/05/2022 0909   ALT 12 05/05/2022 0909   ALKPHOS 48 05/05/2022 0909   BILITOT 1.0 05/05/2022 0909   GFRNONAA >60 05/05/2022 0909   Lab Results  Component Value Date   WBC 4.0 05/05/2022   NEUTROABS 2.7 05/05/2022   HGB 13.6 05/05/2022   HCT 40.0 05/05/2022   MCV 93.9 05/05/2022   PLT 138 (L) 05/05/2022   Imaging:  [facility] Clinician Interpretation: I have personally reviewed the CNS images as listed.  My interpretation, in the context of the patient's clinical presentation, is stable disease  MR Brain W Wo Contrast  Result Date: 06/06/2022 CLINICAL DATA:  Glioblastoma, assess treatment response EXAM: MRI HEAD  WITHOUT AND WITH CONTRAST TECHNIQUE: Multiplanar, multiecho pulse sequences of the brain and surrounding structures were obtained without and with intravenous contrast. CONTRAST:  6mL GADAVIST GADOBUTROL 1 MMOL/ML IV SOLN COMPARISON:  04/04/2022, 01/31/2022, 12/09/2021 FINDINGS: Brain: Status post left occipital craniotomy with subjacent resection cavity in the left occipital lobe. Decreased nodular enhancement along the inferior margin of the resection cavity, now measuring approximately 5 x 3 x 2 mm (AP x TR x CC) (series 16, image 59 and series 100, image 185), previously up to 6 x 9 x 2 mm. No other new or nodular enhancement. Surrounding T2 hyperintense signal appears unchanged from the prior exam, but slightly increased compared to 01/31/2022 and 12/09/2021. No acute infarct, hemorrhage, mass effect, midline shift, or hydrocephalus.  Redemonstrated trace extra-axial collection subjacent to the craniotomy flap. Vascular: Normal arterial flow voids. Normal arterial and venous enhancement. Skull and upper cervical spine: Prior left occipital craniotomy. Otherwise normal marrow signal. Sinuses/Orbits: Clear paranasal sinuses. No acute finding in the orbits. Other: Trace fluid in right mastoid air cells. IMPRESSION: 1. Decreased nodular enhancement along the inferior margin of the left occipital resection cavity. 2. Unchanged surrounding T2 hyperintense signal compared to the immediate prior exam, but slightly increased compared to more remote exams. Electronically Signed   By: Wiliam Ke M.D.   On: 06/06/2022 17:47    Assessment/Plan Glioblastoma, IDH-wildtype  Debbie Horton is clinically stable today, now having completed cycle #7 daily metronomic Temodar.  MRI brain demonstrates stable findings.  Recommended continuing cycle #8 daily metronomic Temozolomide 50 mg/m2. The patient will have a complete blood count performed on days 14 and 28 of each cycle, and a comprehensive metabolic panel performed on day 28 of each cycle. Labs may need to be performed more often. Zofran will prescribed for home use for nausea/vomiting.   Chemotherapy should be held for the following:  ANC less than 1,000  Platelets less than 100,000  LFT or creatinine greater than 2x ULN  If clinical concerns/contraindications develop  Ophthalmologist is arranging for her to get prism glasses for hemianopia.  We ask that Debbie Horton return to clinic in 1 months with labs for review prior to cycle #9, or sooner as needed.  All questions were answered. The patient knows to call the clinic with any problems, questions or concerns. No barriers to learning were detected.  The total time spent in the encounter was 30 minutes and more than 50% was on counseling and review of test results   Henreitta Leber, MD Medical Director of  Neuro-Oncology [facility] at [place] 06/09/22 10:01 AM

## 2022-06-10 ENCOUNTER — Telehealth: Payer: Self-pay | Admitting: Internal Medicine

## 2022-06-10 ENCOUNTER — Other Ambulatory Visit: Payer: Self-pay

## 2022-06-10 NOTE — Telephone Encounter (Signed)
Scheduled per 04/25 los, patient has been called and voicemail was left.

## 2022-06-15 ENCOUNTER — Encounter: Payer: Self-pay | Admitting: Internal Medicine

## 2022-06-15 DIAGNOSIS — C719 Malignant neoplasm of brain, unspecified: Secondary | ICD-10-CM

## 2022-06-16 ENCOUNTER — Encounter: Payer: Self-pay | Admitting: Internal Medicine

## 2022-06-23 ENCOUNTER — Telehealth: Payer: Self-pay | Admitting: Internal Medicine

## 2022-06-23 NOTE — Telephone Encounter (Signed)
Scheduled per 0502 in-basket message, patient has been called and voicemail was left.

## 2022-06-24 ENCOUNTER — Other Ambulatory Visit: Payer: Self-pay

## 2022-07-07 ENCOUNTER — Inpatient Hospital Stay (HOSPITAL_BASED_OUTPATIENT_CLINIC_OR_DEPARTMENT_OTHER): Payer: 59 | Admitting: Internal Medicine

## 2022-07-07 ENCOUNTER — Inpatient Hospital Stay: Payer: 59 | Attending: Internal Medicine

## 2022-07-07 VITALS — BP 94/72 | HR 81 | Temp 98.5°F | Resp 18 | Ht 64.5 in | Wt 125.9 lb

## 2022-07-07 DIAGNOSIS — C719 Malignant neoplasm of brain, unspecified: Secondary | ICD-10-CM

## 2022-07-07 DIAGNOSIS — H5347 Heteronymous bilateral field defects: Secondary | ICD-10-CM | POA: Diagnosis not present

## 2022-07-07 DIAGNOSIS — C714 Malignant neoplasm of occipital lobe: Secondary | ICD-10-CM | POA: Insufficient documentation

## 2022-07-07 LAB — CMP (CANCER CENTER ONLY)
ALT: 13 U/L (ref 0–44)
AST: 18 U/L (ref 15–41)
Albumin: 4.4 g/dL (ref 3.5–5.0)
Alkaline Phosphatase: 47 U/L (ref 38–126)
Anion gap: 4 — ABNORMAL LOW (ref 5–15)
BUN: 22 mg/dL — ABNORMAL HIGH (ref 6–20)
CO2: 31 mmol/L (ref 22–32)
Calcium: 9 mg/dL (ref 8.9–10.3)
Chloride: 106 mmol/L (ref 98–111)
Creatinine: 0.82 mg/dL (ref 0.44–1.00)
GFR, Estimated: >60 mL/min (ref >60)
Glucose, Bld: 89 mg/dL (ref 70–99)
Potassium: 4 mmol/L (ref 3.5–5.1)
Sodium: 141 mmol/L (ref 135–145)
Total Bilirubin: 0.9 mg/dL (ref 0.3–1.2)
Total Protein: 6.6 g/dL (ref 6.5–8.1)

## 2022-07-07 LAB — CBC WITH DIFFERENTIAL (CANCER CENTER ONLY)
Abs Immature Granulocytes: 0.01 10*3/uL (ref 0.00–0.07)
Basophils Absolute: 0 10*3/uL (ref 0.0–0.1)
Basophils Relative: 1 %
Eosinophils Absolute: 0.1 10*3/uL (ref 0.0–0.5)
Eosinophils Relative: 2 %
HCT: 38.3 % (ref 36.0–46.0)
Hemoglobin: 13.1 g/dL (ref 12.0–15.0)
Immature Granulocytes: 0 %
Lymphocytes Relative: 14 %
Lymphs Abs: 0.6 10*3/uL — ABNORMAL LOW (ref 0.7–4.0)
MCH: 32.2 pg (ref 26.0–34.0)
MCHC: 34.2 g/dL (ref 30.0–36.0)
MCV: 94.1 fL (ref 80.0–100.0)
Monocytes Absolute: 0.5 10*3/uL (ref 0.1–1.0)
Monocytes Relative: 12 %
Neutro Abs: 3.1 10*3/uL (ref 1.7–7.7)
Neutrophils Relative %: 71 %
Platelet Count: 146 10*3/uL — ABNORMAL LOW (ref 150–400)
RBC: 4.07 MIL/uL (ref 3.87–5.11)
RDW: 11.5 % (ref 11.5–15.5)
WBC Count: 4.3 10*3/uL (ref 4.0–10.5)
nRBC: 0 % (ref 0.0–0.2)

## 2022-07-07 MED ORDER — TEMOZOLOMIDE 20 MG PO CAPS
80.0000 mg | ORAL_CAPSULE | Freq: Every day | ORAL | 0 refills | Status: DC
Start: 2022-07-07 — End: 2022-08-19

## 2022-07-07 NOTE — Progress Notes (Signed)
[facility] [address]  [place], [place] [address] [phone]   Interval Evaluation  Date of Service: 07/07/22 Patient Name: Debbie Horton Patient MRN: [id number] Patient DOB: Jul 28, 1965 Provider: Henreitta Leber, MD  Identifying Statement:  Debbie Horton is a 57 y.o. female with left occipital glioblastoma    Oncologic History: Oncology History  Glioblastoma, IDH-wildtype (HCC)  07/06/2021 Surgery   Left occipital craniotomy, resection with Dr. Jake Samples; path is Glioblastoma, IDHwt    - 09/24/2021 Radiation Therapy   Completes IMRT+TMZ at [facility]   10/25/2021 -  Chemotherapy   Initiates cycle #1 adjuvant Temodar through [facility] team   11/23/2021 -  Chemotherapy   Patient is on Treatment Plan : BRAIN GLIOBLASTOMA Consolidation Temozolomide Days 1-5 q28 Days        Biomarkers:  MGMT Unknown.  IDH 1/2 Wild type.  EGFR Unknown  TERT Unknown   Interval History: [name] presents today for follow up, now having completed cycle #8 of metronomic daily Temodar.  Continues to tolerate treatment well.  No new or progressive changes.  Denies seizures.    H+P (07/22/21) Patient presented to medical attention last month with new onset headaches, blurry vision and episodes of confusion.  She obtained CNS imaging in the hospital which demonstrated an enhancing mass in the left occipital lobe.  She underwent craniotomy, resection with Dr. Jake Samples; path demonstrated glioblastoma IDHwt.  Following surgery, she has tapered off steroids.  She feels close to her baseline, aside from right visual field impairment, not improved.  She is fully functional, independent, and otherwise healthy.  Medications: Current Outpatient Medications on File Prior to Visit  Medication Sig Dispense Refill   acetaminophen (TYLENOL) 500 MG tablet Take 1,000 mg by mouth every 8 (eight) hours as needed for headache.     cycloSPORINE (RESTASIS) 0.05  % ophthalmic emulsion Apply to eye.     estradiol (CLIMARA - DOSED IN MG/24 HR) 0.05 mg/24hr patch Place 0.05 mg onto the skin every Wednesday.     ibuprofen (ADVIL) 200 MG tablet Take 400 mg by mouth every 8 (eight) hours as needed for headache.     levocetirizine (XYZAL) 5 MG tablet Take 5 mg by mouth daily as needed (seasonal allergies).     ondansetron (ZOFRAN) 8 MG tablet Take 1 tablet (8 mg total) by mouth every 8 (eight) hours as needed for nausea or vomiting. May take 30-60 minutes prior to Temodar administration if nausea/vomiting occurs as needed. 30 tablet 1   polyethylene glycol (MIRALAX / GLYCOLAX) 17 g packet Take 17 g by mouth daily as needed.     progesterone (PROMETRIUM) 200 MG capsule Take 200 mg by mouth at bedtime.     temozolomide (TEMODAR) 20 MG capsule TAKE 4 CAPSULES BY MOUTH DAILY 112 capsule 0   No current facility-administered medications on file prior to visit.    Allergies:  Allergies  Allergen Reactions   Shellfish-Derived Products Nausea Only   Latex Rash   Penicillins Rash   Phenergan [Promethazine] Palpitations   Sulfa Antibiotics Rash   Past Medical History: No past medical history on file. Past Surgical History:  Past Surgical History:  Procedure Laterality Date   APPLICATION OF CRANIAL NAVIGATION N/A 07/06/2021   Procedure: APPLICATION OF CRANIAL NAVIGATION;  Surgeon: Dawley, Alan Mulder, DO;  Location: [facility] OR;  Service: Neurosurgery;  Laterality: N/A;   CRANIOTOMY N/A 07/06/2021   Procedure: LEFT OCCIPITAL CRANIOTOMY FOR TUMOR EXCISION;  Surgeon:  Dawley, Alan Mulder, DO;  Location: [facility] OR;  Service: Neurosurgery;  Laterality: N/A;   Social History:  Social History   Socioeconomic History   Marital status: Married    Spouse name: Not on file   Number of children: Not on file   Years of education: Not on file   Highest education level: Not on file  Occupational History   Occupation: [employer]  Tobacco Use   Smoking  status: Never   Smokeless tobacco: Never  Vaping Use   Vaping Use: Never used  Substance and Sexual Activity   Alcohol use: Yes    Comment: occasional   Drug use: Never   Sexual activity: Yes    Birth control/protection: Post-menopausal  Other Topics Concern   Not on file  Social History Narrative   2 children (15 yr son and 82 yr daughter).   Social Determinants of Health   Financial Resource Strain: Not on file  Food Insecurity: Not on file  Transportation Needs: Not on file  Physical Activity: Not on file  Stress: Not on file  Social Connections: Not on file  Intimate Partner Violence: Not on file   Family History: No family history on file.  Review of Systems: Constitutional: Doesn't report fevers, chills or abnormal weight loss Eyes: Doesn't report blurriness of vision Ears, nose, mouth, throat, and face: Doesn't report sore throat Respiratory: Doesn't report cough, dyspnea or wheezes Cardiovascular: Doesn't report palpitation, chest discomfort  Gastrointestinal:  Doesn't report nausea, constipation, diarrhea GU: Doesn't report incontinence Skin: Doesn't report skin rashes Neurological: Per HPI Musculoskeletal: Doesn't report joint pain Behavioral/Psych: Doesn't report anxiety  Physical Exam: There were no vitals filed for this visit.  KPS: 80. General: Alert, cooperative, pleasant, in no acute distress Head: Normal EENT: No conjunctival injection or scleral icterus.  Lungs: Resp effort normal Cardiac: Regular rate Abdomen: Non-distended abdomen Skin: No rashes cyanosis or petechiae. Extremities: No clubbing or edema  Neurologic Exam: Mental Status: Awake, alert, attentive to examiner. Oriented to self and environment. Language is fluent with intact comprehension.  Cranial Nerves: Visual acuity is grossly normal. R hemianopia. Extra-ocular movements intact. No ptosis. Face is symmetric Motor: Tone and bulk are normal. Power is full in both arms and legs.  Reflexes are symmetric, no pathologic reflexes present.  Sensory: Intact to light touch Gait: Normal.   Labs: I have reviewed the data as listed    Component Value Date/Time   NA 140 06/09/2022 0951   K 4.1 06/09/2022 0951   CL 105 06/09/2022 0951   CO2 30 06/09/2022 0951   GLUCOSE 76 06/09/2022 0951   BUN 18 06/09/2022 0951   CREATININE 0.85 06/09/2022 0951   CALCIUM 9.2 06/09/2022 0951   PROT 6.6 06/09/2022 0951   ALBUMIN 4.3 06/09/2022 0951   AST 17 06/09/2022 0951   ALT 12 06/09/2022 0951   ALKPHOS 44 06/09/2022 0951   BILITOT 1.0 06/09/2022 0951   GFRNONAA >60 06/09/2022 0951   Lab Results  Component Value Date   WBC 4.3 07/07/2022   NEUTROABS 3.1 07/07/2022   HGB 13.1 07/07/2022   HCT 38.3 07/07/2022   MCV 94.1 07/07/2022   PLT 146 (L) 07/07/2022   Imaging:  [facility] Clinician Interpretation: I have personally reviewed the CNS images as listed.  My interpretation, in the context of the patient's clinical presentation, is stable disease  No results found.  Assessment/Plan Glioblastoma, IDH-wildtype (HCC)  Johnnette Litter Bottino is clinically stable today, now having completed cycle #  8 daily metronomic Temodar.  No new or progressive changes.  Recommended continuing cycle #9 daily metronomic Temozolomide 50 mg/m2. The patient will have a complete blood count performed on days 14 and 28 of each cycle, and a comprehensive metabolic panel performed on day 28 of each cycle. Labs may need to be performed more often. Zofran will prescribed for home use for nausea/vomiting.   Chemotherapy should be held for the following:  ANC less than 1,000  Platelets less than 100,000  LFT or creatinine greater than 2x ULN  If clinical concerns/contraindications develop  Ophthalmologist is arranging for her to get prism glasses for hemianopia.  We ask that Jules Husbands return to clinic in 1 months with MRI brain (08/05/22) for review prior to cycle #10, or sooner as  needed.  All questions were answered. The patient knows to call the clinic with any problems, questions or concerns. No barriers to learning were detected.  The total time spent in the encounter was 30 minutes and more than 50% was on counseling and review of test results   Henreitta Leber, MD Medical Director of Neuro-Oncology [facility] [place] 07/07/22 10:57 AM

## 2022-07-17 ENCOUNTER — Other Ambulatory Visit: Payer: Self-pay

## 2022-07-19 ENCOUNTER — Telehealth: Payer: Self-pay | Admitting: Internal Medicine

## 2022-07-19 NOTE — Telephone Encounter (Signed)
Scheduled per 05/29 scheduling message, patient has been called and voicemail was left. 

## 2022-07-22 ENCOUNTER — Telehealth: Payer: Self-pay

## 2022-07-22 NOTE — Telephone Encounter (Signed)
Notified Patient by voicemail of completion of Disability Forms. Fax transmission confirmation received. Copy of forms mailed to Patient. 

## 2022-08-05 ENCOUNTER — Ambulatory Visit (HOSPITAL_COMMUNITY)
Admission: RE | Admit: 2022-08-05 | Discharge: 2022-08-05 | Disposition: A | Discharge status: Home / Self Care | Payer: 59 | Source: Ambulatory Visit | Attending: Internal Medicine | Admitting: Internal Medicine

## 2022-08-05 DIAGNOSIS — C719 Malignant neoplasm of brain, unspecified: Secondary | ICD-10-CM

## 2022-08-05 MED ORDER — GADOBUTROL 1 MMOL/ML IV SOLN
5.0000 mL | Freq: Once | INTRAVENOUS | Status: AC | PRN
  Administered 2022-08-05: 5 mL via INTRAVENOUS

## 2022-08-08 ENCOUNTER — Encounter: Payer: Self-pay | Admitting: *Deleted

## 2022-08-08 NOTE — Progress Notes (Signed)
Requested MRI be pushed out to [facility].

## 2022-08-11 ENCOUNTER — Inpatient Hospital Stay: Payer: 59

## 2022-08-11 ENCOUNTER — Inpatient Hospital Stay: Payer: 59 | Attending: Internal Medicine | Admitting: Internal Medicine

## 2022-08-11 ENCOUNTER — Other Ambulatory Visit: Payer: Self-pay

## 2022-08-11 VITALS — BP 90/73 | HR 79 | Temp 97.9°F | Resp 17 | Wt 124.9 lb

## 2022-08-11 DIAGNOSIS — H5347 Heteronymous bilateral field defects: Secondary | ICD-10-CM | POA: Diagnosis not present

## 2022-08-11 DIAGNOSIS — C719 Malignant neoplasm of brain, unspecified: Secondary | ICD-10-CM | POA: Diagnosis not present

## 2022-08-11 DIAGNOSIS — C714 Malignant neoplasm of occipital lobe: Secondary | ICD-10-CM | POA: Diagnosis present

## 2022-08-11 LAB — CBC WITH DIFFERENTIAL (CANCER CENTER ONLY)
Abs Immature Granulocytes: 0.01 10*3/uL (ref 0.00–0.07)
Basophils Absolute: 0 10*3/uL (ref 0.0–0.1)
Basophils Relative: 1 %
Eosinophils Absolute: 0.1 10*3/uL (ref 0.0–0.5)
Eosinophils Relative: 3 %
HCT: 40.3 % (ref 36.0–46.0)
Hemoglobin: 13.6 g/dL (ref 12.0–15.0)
Immature Granulocytes: 0 %
Lymphocytes Relative: 14 %
Lymphs Abs: 0.4 10*3/uL — ABNORMAL LOW (ref 0.7–4.0)
MCH: 32 pg (ref 26.0–34.0)
MCHC: 33.7 g/dL (ref 30.0–36.0)
MCV: 94.8 fL (ref 80.0–100.0)
Monocytes Absolute: 0.5 10*3/uL (ref 0.1–1.0)
Monocytes Relative: 15 %
Neutro Abs: 2 10*3/uL (ref 1.7–7.7)
Neutrophils Relative %: 67 %
Platelet Count: 136 10*3/uL — ABNORMAL LOW (ref 150–400)
RBC: 4.25 MIL/uL (ref 3.87–5.11)
RDW: 11.5 % (ref 11.5–15.5)
WBC Count: 3 10*3/uL — ABNORMAL LOW (ref 4.0–10.5)
nRBC: 0 % (ref 0.0–0.2)

## 2022-08-11 LAB — CMP (CANCER CENTER ONLY)
ALT: 13 U/L (ref 0–44)
AST: 17 U/L (ref 15–41)
Albumin: 4.3 g/dL (ref 3.5–5.0)
Alkaline Phosphatase: 50 U/L (ref 38–126)
Anion gap: 5 (ref 5–15)
BUN: 20 mg/dL (ref 6–20)
CO2: 30 mmol/L (ref 22–32)
Calcium: 9.4 mg/dL (ref 8.9–10.3)
Chloride: 106 mmol/L (ref 98–111)
Creatinine: 0.82 mg/dL (ref 0.44–1.00)
GFR, Estimated: >60 mL/min (ref >60)
Glucose, Bld: 74 mg/dL (ref 70–99)
Potassium: 4.7 mmol/L (ref 3.5–5.1)
Sodium: 141 mmol/L (ref 135–145)
Total Bilirubin: 0.7 mg/dL (ref 0.3–1.2)
Total Protein: 7.2 g/dL (ref 6.5–8.1)

## 2022-08-11 NOTE — Progress Notes (Signed)
[facility] [address]  [place], [place] [address] [phone]   Interval Evaluation  Date of Service: 08/11/22 Patient Name: Debbie Horton Patient MRN: [id number] Patient DOB: September 28, 1965 Provider: Henreitta Leber, MD  Identifying Statement:  Debbie Horton is a 57 y.o. female with left occipital glioblastoma    Oncologic History: Oncology History  Glioblastoma, IDH-wildtype (HCC)  07/06/2021 Surgery   Left occipital craniotomy, resection with Dr. Jake Samples; path is Glioblastoma, IDHwt    - 09/24/2021 Radiation Therapy   Completes IMRT+TMZ at [facility]   10/25/2021 -  Chemotherapy   Initiates cycle #1 adjuvant Temodar through [facility] team   11/23/2021 -  Chemotherapy   Patient is on Treatment Plan : BRAIN GLIOBLASTOMA Consolidation Temozolomide Days 1-5 q28 Days        Biomarkers:  MGMT Unknown.  IDH 1/2 Wild type.  EGFR Unknown  TERT Unknown   Interval History: Oswin Johal presents today for follow up, now having completed cycle #9 of metronomic daily Temodar.  No issues with recent MRI brain.  Continues to tolerate treatment well.  No new or progressive changes.  Denies seizures.    H+P (07/22/21) Patient presented to medical attention last month with new onset headaches, blurry vision and episodes of confusion.  She obtained CNS imaging in the hospital which demonstrated an enhancing mass in the left occipital lobe.  She underwent craniotomy, resection with Dr. Jake Samples; path demonstrated glioblastoma IDHwt.  Following surgery, she has tapered off steroids.  She feels close to her baseline, aside from right visual field impairment, not improved.  She is fully functional, independent, and otherwise healthy.  Medications: Current Outpatient Medications on File Prior to Visit  Medication Sig Dispense Refill   acetaminophen (TYLENOL) 500 MG tablet Take 1,000 mg by mouth every 8 (eight) hours as needed for headache.      cycloSPORINE (RESTASIS) 0.05 % ophthalmic emulsion Apply to eye.     estradiol (CLIMARA - DOSED IN MG/24 HR) 0.05 mg/24hr patch Place 0.05 mg onto the skin every Wednesday.     ibuprofen (ADVIL) 200 MG tablet Take 400 mg by mouth every 8 (eight) hours as needed for headache.     levocetirizine (XYZAL) 5 MG tablet Take 5 mg by mouth daily as needed (seasonal allergies).     ondansetron (ZOFRAN) 8 MG tablet Take 1 tablet (8 mg total) by mouth every 8 (eight) hours as needed for nausea or vomiting. May take 30-60 minutes prior to Temodar administration if nausea/vomiting occurs as needed. 30 tablet 1   polyethylene glycol (MIRALAX / GLYCOLAX) 17 g packet Take 17 g by mouth daily as needed.     progesterone (PROMETRIUM) 200 MG capsule Take 200 mg by mouth at bedtime.     temozolomide (TEMODAR) 20 MG capsule Take 4 capsules (80 mg total) by mouth daily. 112 capsule 0   No current facility-administered medications on file prior to visit.    Allergies:  Allergies  Allergen Reactions   Shellfish-Derived Products Nausea Only   Latex Rash   Penicillins Rash   Phenergan [Promethazine] Palpitations   Sulfa Antibiotics Rash   Past Medical History: No past medical history on file. Past Surgical History:  Past Surgical History:  Procedure Laterality Date   APPLICATION OF CRANIAL NAVIGATION N/A 07/06/2021   Procedure: APPLICATION OF CRANIAL NAVIGATION;  Surgeon: Dawley, Alan Mulder, DO;  Location: [facility] OR;  Service: Neurosurgery;  Laterality: N/A;   CRANIOTOMY N/A 07/06/2021  Procedure: LEFT OCCIPITAL CRANIOTOMY FOR TUMOR EXCISION;  Surgeon: Dawley, Alan Mulder, DO;  Location: [facility] OR;  Service: Neurosurgery;  Laterality: N/A;   Social History:  Social History   Socioeconomic History   Marital status: Married    Spouse name: Not on file   Number of children: Not on file   Years of education: Not on file   Highest education level: Not on file  Occupational History   Occupation: [employer]  Tobacco Use   Smoking status: Never   Smokeless tobacco: Never  Vaping Use   Vaping Use: Never used  Substance and Sexual Activity   Alcohol use: Yes    Comment: occasional   Drug use: Never   Sexual activity: Yes    Birth control/protection: Post-menopausal  Other Topics Concern   Not on file  Social History Narrative   2 children (15 yr son and 51 yr daughter).   Social Determinants of Health   Financial Resource Strain: Not on file  Food Insecurity: Not on file  Transportation Needs: Not on file  Physical Activity: Not on file  Stress: Not on file  Social Connections: Not on file  Intimate Partner Violence: Not on file   Family History: No family history on file.  Review of Systems: Constitutional: Doesn't report fevers, chills or abnormal weight loss Eyes: Doesn't report blurriness of vision Ears, nose, mouth, throat, and face: Doesn't report sore throat Respiratory: Doesn't report cough, dyspnea or wheezes Cardiovascular: Doesn't report palpitation, chest discomfort  Gastrointestinal:  Doesn't report nausea, constipation, diarrhea GU: Doesn't report incontinence Skin: Doesn't report skin rashes Neurological: Per HPI Musculoskeletal: Doesn't report joint pain Behavioral/Psych: Doesn't report anxiety  Physical Exam: Vitals:   08/11/22 0926  BP: 90/73  Pulse: 79  Resp: 17  Temp: 97.9 F (36.6 C)  SpO2: 100%    KPS: 80. General: Alert, cooperative, pleasant, in no acute distress Head: Normal EENT: No conjunctival injection or scleral icterus.  Lungs: Resp effort normal Cardiac: Regular rate Abdomen: Non-distended abdomen Skin: No rashes cyanosis or petechiae. Extremities: No clubbing or edema  Neurologic Exam: Mental Status: Awake, alert, attentive to examiner. Oriented to self and environment. Language is fluent with intact comprehension.  Cranial Nerves: Visual acuity is grossly normal. R hemianopia. Extra-ocular movements  intact. No ptosis. Face is symmetric Motor: Tone and bulk are normal. Power is full in both arms and legs. Reflexes are symmetric, no pathologic reflexes present.  Sensory: Intact to light touch Gait: Normal.   Labs: I have reviewed the data as listed    Component Value Date/Time   NA 141 07/07/2022 1024   K 4.0 07/07/2022 1024   CL 106 07/07/2022 1024   CO2 31 07/07/2022 1024   GLUCOSE 89 07/07/2022 1024   BUN 22 (H) 07/07/2022 1024   CREATININE 0.82 07/07/2022 1024   CALCIUM 9.0 07/07/2022 1024   PROT 6.6 07/07/2022 1024   ALBUMIN 4.4 07/07/2022 1024   AST 18 07/07/2022 1024   ALT 13 07/07/2022 1024   ALKPHOS 47 07/07/2022 1024   BILITOT 0.9 07/07/2022 1024   GFRNONAA >60 07/07/2022 1024   Lab Results  Component Value Date   WBC 3.0 (L) 08/11/2022   NEUTROABS 2.0 08/11/2022   HGB 13.6 08/11/2022   HCT 40.3 08/11/2022   MCV 94.8 08/11/2022   PLT 136 (L) 08/11/2022   Imaging:  [facility] Clinician Interpretation: I have personally reviewed the CNS images as listed.  My interpretation, in the context of  the patient's clinical presentation, is stable disease pending official read  No results found.  Assessment/Plan Glioblastoma, IDH-wildtype (HCC)  Johnnette Litter Glasner is clinically stable today, now having completed cycle #9 daily metronomic Temodar.  MRI demonstrates stable findings.  Recommended continuing cycle #10 daily metronomic Temozolomide 50 mg/m2. The patient will have a complete blood count performed on days 14 and 28 of each cycle, and a comprehensive metabolic panel performed on day 28 of each cycle. Labs may need to be performed more often. Zofran will prescribed for home use for nausea/vomiting.   Chemotherapy should be held for the following:  ANC less than 1,000  Platelets less than 100,000  LFT or creatinine greater than 2x ULN  If clinical concerns/contraindications develop  Ophthalmologist is arranging for her to get prism glasses for  hemianopia.  We ask that Jules Husbands return to clinic in 1 months with labs for review prior to cycle #11, or sooner as needed.  All questions were answered. The patient knows to call the clinic with any problems, questions or concerns. No barriers to learning were detected.  The total time spent in the encounter was 30 minutes and more than 50% was on counseling and review of test results   Henreitta Leber, MD Medical Director of Neuro-Oncology [facility] [place] 08/11/22 9:36 AM

## 2022-08-12 ENCOUNTER — Telehealth: Payer: Self-pay | Admitting: Medical Oncology

## 2022-08-12 ENCOUNTER — Telehealth: Payer: Self-pay | Admitting: Internal Medicine

## 2022-08-12 NOTE — Telephone Encounter (Signed)
Scheduled per 06/27 los, patient has been called and voicemail was left. 

## 2022-08-12 NOTE — Telephone Encounter (Signed)
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  Looks like you completed this.

## 2022-08-13 ENCOUNTER — Other Ambulatory Visit: Payer: Self-pay

## 2022-08-19 ENCOUNTER — Other Ambulatory Visit: Payer: Self-pay | Admitting: Internal Medicine

## 2022-08-19 ENCOUNTER — Telehealth: Payer: Self-pay

## 2022-08-19 DIAGNOSIS — C719 Malignant neoplasm of brain, unspecified: Secondary | ICD-10-CM

## 2022-08-19 NOTE — Telephone Encounter (Signed)
Notified Patient by voicemail of prior authorization approval for Temozolomide 20mg  Capsules. Medication is approved through 08/19/2023.

## 2022-09-08 ENCOUNTER — Other Ambulatory Visit: Payer: Self-pay

## 2022-09-08 ENCOUNTER — Inpatient Hospital Stay (HOSPITAL_BASED_OUTPATIENT_CLINIC_OR_DEPARTMENT_OTHER): Payer: 59 | Admitting: Internal Medicine

## 2022-09-08 ENCOUNTER — Inpatient Hospital Stay: Payer: 59 | Attending: Internal Medicine

## 2022-09-08 VITALS — BP 106/74 | HR 72 | Temp 97.5°F | Resp 18 | Wt 124.2 lb

## 2022-09-08 DIAGNOSIS — C719 Malignant neoplasm of brain, unspecified: Secondary | ICD-10-CM

## 2022-09-08 DIAGNOSIS — C714 Malignant neoplasm of occipital lobe: Secondary | ICD-10-CM | POA: Diagnosis present

## 2022-09-08 LAB — CBC WITH DIFFERENTIAL (CANCER CENTER ONLY)
Abs Immature Granulocytes: 0.01 10*3/uL (ref 0.00–0.07)
Basophils Absolute: 0 10*3/uL (ref 0.0–0.1)
Basophils Relative: 1 %
Eosinophils Absolute: 0.1 10*3/uL (ref 0.0–0.5)
Eosinophils Relative: 2 %
HCT: 35.7 % — ABNORMAL LOW (ref 36.0–46.0)
Hemoglobin: 12.4 g/dL (ref 12.0–15.0)
Immature Granulocytes: 0 %
Lymphocytes Relative: 16 %
Lymphs Abs: 0.6 10*3/uL — ABNORMAL LOW (ref 0.7–4.0)
MCH: 32.4 pg (ref 26.0–34.0)
MCHC: 34.7 g/dL (ref 30.0–36.0)
MCV: 93.2 fL (ref 80.0–100.0)
Monocytes Absolute: 0.4 10*3/uL (ref 0.1–1.0)
Monocytes Relative: 11 %
Neutro Abs: 2.5 10*3/uL (ref 1.7–7.7)
Neutrophils Relative %: 70 %
Platelet Count: 130 10*3/uL — ABNORMAL LOW (ref 150–400)
RBC: 3.83 MIL/uL — ABNORMAL LOW (ref 3.87–5.11)
RDW: 11.5 % (ref 11.5–15.5)
WBC Count: 3.5 10*3/uL — ABNORMAL LOW (ref 4.0–10.5)
nRBC: 0 % (ref 0.0–0.2)

## 2022-09-08 LAB — CMP (CANCER CENTER ONLY)
ALT: 13 U/L (ref 0–44)
AST: 18 U/L (ref 15–41)
Albumin: 4.4 g/dL (ref 3.5–5.0)
Alkaline Phosphatase: 46 U/L (ref 38–126)
Anion gap: 5 (ref 5–15)
BUN: 19 mg/dL (ref 6–20)
CO2: 29 mmol/L (ref 22–32)
Calcium: 9.4 mg/dL (ref 8.9–10.3)
Chloride: 105 mmol/L (ref 98–111)
Creatinine: 0.82 mg/dL (ref 0.44–1.00)
GFR, Estimated: >60 mL/min (ref >60)
Glucose, Bld: 86 mg/dL (ref 70–99)
Potassium: 4.3 mmol/L (ref 3.5–5.1)
Sodium: 139 mmol/L (ref 135–145)
Total Bilirubin: 1 mg/dL (ref 0.3–1.2)
Total Protein: 6.4 g/dL — ABNORMAL LOW (ref 6.5–8.1)

## 2022-09-08 NOTE — Progress Notes (Signed)
[facility] at [facility] [address]  [place], [place] [address] [phone]   Interval Evaluation  Date of Service: 09/08/22 Patient Name: Debbie Horton Patient MRN: [id number] Patient DOB: 1965/11/14 Provider: Henreitta Leber, MD  Identifying Statement:  Debbie Horton is a 57 y.o. female with left occipital glioblastoma    Oncologic History: Oncology History  Glioblastoma, IDH-wildtype (HCC)  07/06/2021 Surgery   Left occipital craniotomy, resection with Dr. Jake Samples; path is Glioblastoma, IDHwt    - 09/24/2021 Radiation Therapy   Completes IMRT+TMZ at [facility]   10/25/2021 -  Chemotherapy   Initiates cycle #1 adjuvant Temodar through [facility] team   11/23/2021 -  Chemotherapy   Patient is on Treatment Plan : BRAIN GLIOBLASTOMA Consolidation Temozolomide Days 1-5 q28 Days        Biomarkers:  MGMT Unknown.  IDH 1/2 Wild type.  EGFR Unknown  TERT Unknown   Interval History: Debbie Horton presents today for follow up, now having completed cycle #10 of metronomic daily Temodar.  No issues with recent MRI brain.  Continues to tolerate treatment well.  Denies clinical changes.  Denies seizures.    H+P (07/22/21) Patient presented to medical attention last month with new onset headaches, blurry vision and episodes of confusion.  She obtained CNS imaging in the hospital which demonstrated an enhancing mass in the left occipital lobe.  She underwent craniotomy, resection with Dr. Jake Samples; path demonstrated glioblastoma IDHwt.  Following surgery, she has tapered off steroids.  She feels close to her baseline, aside from right visual field impairment, not improved.  She is fully functional, independent, and otherwise healthy.  Medications: Current Outpatient Medications on File Prior to Visit  Medication Sig Dispense Refill   acetaminophen (TYLENOL) 500 MG tablet Take 1,000 mg by mouth every 8 (eight) hours as needed for headache.      Cholecalciferol (VITAMIN D-3 PO) Take 1 capsule by mouth daily.     cycloSPORINE (RESTASIS) 0.05 % ophthalmic emulsion Apply to eye.     estradiol (CLIMARA - DOSED IN MG/24 HR) 0.05 mg/24hr patch Place 0.05 mg onto the skin every Wednesday.     ibuprofen (ADVIL) 200 MG tablet Take 400 mg by mouth every 8 (eight) hours as needed for headache.     levocetirizine (XYZAL) 5 MG tablet Take 5 mg by mouth daily as needed (seasonal allergies).     Magnesium Gluconate 500 (27 Mg) MG TABS Take by mouth.     MAGNESIUM GLUCONATE PO Take 400 mg by mouth daily.     ondansetron (ZOFRAN) 8 MG tablet Take 1 tablet (8 mg total) by mouth every 8 (eight) hours as needed for nausea or vomiting. May take 30-60 minutes prior to Temodar administration if nausea/vomiting occurs as needed. 30 tablet 1   polyethylene glycol (MIRALAX / GLYCOLAX) 17 g packet Take 17 g by mouth daily as needed.     progesterone (PROMETRIUM) 200 MG capsule Take 200 mg by mouth at bedtime.     temozolomide (TEMODAR) 20 MG capsule TAKE 4 CAPSULES BY MOUTH DAILY 112 capsule 0   No current facility-administered medications on file prior to visit.    Allergies:  Allergies  Allergen Reactions   Shellfish-Derived Products Nausea Only   Latex Rash   Penicillins Rash   Phenergan [Promethazine] Palpitations   Sulfa Antibiotics Rash   Past Medical History: No past medical history on file. Past Surgical History:  Past Surgical History:  Procedure Laterality Date  APPLICATION OF CRANIAL NAVIGATION N/A 07/06/2021   Procedure: APPLICATION OF CRANIAL NAVIGATION;  Surgeon: Dawley, Alan Mulder, DO;  Location: [facility] OR;  Service: Neurosurgery;  Laterality: N/A;   CRANIOTOMY N/A 07/06/2021   Procedure: LEFT OCCIPITAL CRANIOTOMY FOR TUMOR EXCISION;  Surgeon: Dawley, Alan Mulder, DO;  Location: [facility] OR;  Service: Neurosurgery;  Laterality: N/A;   Social History:  Social History   Socioeconomic History   Marital status: Married    Spouse name: Not on file    Number of children: Not on file   Years of education: Not on file   Highest education level: Not on file  Occupational History   Occupation: [employer]  Tobacco Use   Smoking status: Never   Smokeless tobacco: Never  Vaping Use   Vaping status: Never Used  Substance and Sexual Activity   Alcohol use: Yes    Comment: occasional   Drug use: Never   Sexual activity: Yes    Birth control/protection: Post-menopausal  Other Topics Concern   Not on file  Social History Narrative   2 children (15 yr son and 46 yr daughter).   Social Determinants of Health   Financial Resource Strain: Not on file  Food Insecurity: Not on file  Transportation Needs: Not on file  Physical Activity: Not on file  Stress: Not on file  Social Connections: Not on file  Intimate Partner Violence: Not on file   Family History: No family history on file.  Review of Systems: Constitutional: Doesn't report fevers, chills or abnormal weight loss Eyes: Doesn't report blurriness of vision Ears, nose, mouth, throat, and face: Doesn't report sore throat Respiratory: Doesn't report cough, dyspnea or wheezes Cardiovascular: Doesn't report palpitation, chest discomfort  Gastrointestinal:  Doesn't report nausea, constipation, diarrhea GU: Doesn't report incontinence Skin: Doesn't report skin rashes Neurological: Per HPI Musculoskeletal: Doesn't report joint pain Behavioral/Psych: Doesn't report anxiety  Physical Exam: Vitals:   09/08/22 1230  BP: 106/74  Pulse: 72  Resp: 18  Temp: (!) 97.5 F (36.4 C)  SpO2: 100%    KPS: 80. General: Alert, cooperative, pleasant, in no acute distress Head: Normal EENT: No conjunctival injection or scleral icterus.  Lungs: Resp effort normal Cardiac: Regular rate Abdomen: Non-distended abdomen Skin: No rashes cyanosis or petechiae. Extremities: No clubbing or edema  Neurologic Exam: Mental Status: Awake, alert, attentive to  examiner. Oriented to self and environment. Language is fluent with intact comprehension.  Cranial Nerves: Visual acuity is grossly normal. R hemianopia. Extra-ocular movements intact. No ptosis. Face is symmetric Motor: Tone and bulk are normal. Power is full in both arms and legs. Reflexes are symmetric, no pathologic reflexes present.  Sensory: Intact to light touch Gait: Normal.   Labs: I have reviewed the data as listed    Component Value Date/Time   NA 141 08/11/2022 0907   K 4.7 08/11/2022 0907   CL 106 08/11/2022 0907   CO2 30 08/11/2022 0907   GLUCOSE 74 08/11/2022 0907   BUN 20 08/11/2022 0907   CREATININE 0.82 08/11/2022 0907   CALCIUM 9.4 08/11/2022 0907   PROT 7.2 08/11/2022 0907   ALBUMIN 4.3 08/11/2022 0907   AST 17 08/11/2022 0907   ALT 13 08/11/2022 0907   ALKPHOS 50 08/11/2022 0907   BILITOT 0.7 08/11/2022 0907   GFRNONAA >60 08/11/2022 0907   Lab Results  Component Value Date   WBC 3.5 (L) 09/08/2022   NEUTROABS 2.5 09/08/2022   HGB 12.4 09/08/2022   HCT  35.7 (L) 09/08/2022   MCV 93.2 09/08/2022   PLT 130 (L) 09/08/2022   Imaging:  [facility] Clinician Interpretation: I have personally reviewed the CNS images as listed.  My interpretation, in the context of the patient's clinical presentation, is stable disease pending official read  No results found.  Assessment/Plan Glioblastoma, IDH-wildtype (HCC)  Debbie Horton is clinically stable today, now having completed cycle #10 daily metronomic Temodar.  Labs are within normal limits.  Recommended continuing cycle #11 daily metronomic Temozolomide 50 mg/m2. The patient will have a complete blood count performed on days 14 and 28 of each cycle, and a comprehensive metabolic panel performed on day 28 of each cycle. Labs may need to be performed more often. Zofran will prescribed for home use for nausea/vomiting.   Chemotherapy should be held for the following:  ANC less than 1,000  Platelets less than  100,000  LFT or creatinine greater than 2x ULN  If clinical concerns/contraindications develop  Ophthalmologist is arranging for her to get prism glasses for hemianopia.  We ask that Jules Husbands return to clinic in 1 months with MRI for review, or sooner as needed.  All questions were answered. The patient knows to call the clinic with any problems, questions or concerns. No barriers to learning were detected.  The total time spent in the encounter was 30 minutes and more than 50% was on counseling and review of test results   Henreitta Leber, MD Medical Director of Neuro-Oncology [facility] at [facility] 09/08/22 12:45 PM

## 2022-09-09 ENCOUNTER — Other Ambulatory Visit: Payer: Self-pay

## 2022-09-10 ENCOUNTER — Other Ambulatory Visit: Payer: Self-pay | Admitting: Internal Medicine

## 2022-09-10 DIAGNOSIS — C719 Malignant neoplasm of brain, unspecified: Secondary | ICD-10-CM

## 2022-09-11 ENCOUNTER — Other Ambulatory Visit: Payer: Self-pay

## 2022-09-11 ENCOUNTER — Encounter: Payer: Self-pay | Admitting: Internal Medicine

## 2022-09-11 DIAGNOSIS — C719 Malignant neoplasm of brain, unspecified: Secondary | ICD-10-CM

## 2022-09-12 ENCOUNTER — Encounter: Payer: Self-pay | Admitting: Internal Medicine

## 2022-09-12 MED ORDER — TEMOZOLOMIDE 20 MG PO CAPS
80.0000 mg | ORAL_CAPSULE | Freq: Every day | ORAL | 0 refills | Status: DC
Start: 2022-09-12 — End: 2024-01-02

## 2022-09-30 ENCOUNTER — Ambulatory Visit (HOSPITAL_COMMUNITY): Admission: RE | Admit: 2022-09-30 | Payer: 59 | Source: Ambulatory Visit

## 2022-09-30 DIAGNOSIS — C719 Malignant neoplasm of brain, unspecified: Secondary | ICD-10-CM | POA: Diagnosis not present

## 2022-09-30 MED ORDER — GADOBUTROL 1 MMOL/ML IV SOLN
5.0000 mL | Freq: Once | INTRAVENOUS | Status: AC | PRN
  Administered 2022-09-30: 5 mL via INTRAVENOUS

## 2022-10-03 ENCOUNTER — Other Ambulatory Visit: Payer: Self-pay | Admitting: Internal Medicine

## 2022-10-03 DIAGNOSIS — C719 Malignant neoplasm of brain, unspecified: Secondary | ICD-10-CM

## 2022-10-03 NOTE — Telephone Encounter (Signed)
Patient will see provider and have labs done prior to refilling oral chemo.

## 2022-10-06 ENCOUNTER — Ambulatory Visit: Payer: 59 | Admitting: Internal Medicine

## 2022-10-06 ENCOUNTER — Other Ambulatory Visit: Payer: Self-pay

## 2022-10-06 ENCOUNTER — Inpatient Hospital Stay: Payer: 59 | Admitting: Internal Medicine

## 2022-10-06 ENCOUNTER — Inpatient Hospital Stay: Payer: 59

## 2022-10-06 ENCOUNTER — Telehealth: Payer: Self-pay

## 2022-10-06 NOTE — Telephone Encounter (Signed)
Called patient and left message with appt times for 8/27 and left her COVID precautions.  Instructed her to call the Cancer Center at [phone] if the 8/27 date did not work for her. Lorayne Marek, RN

## 2022-10-06 NOTE — Telephone Encounter (Signed)
-----   Message from Henreitta Leber sent at 10/05/2022 11:25 AM EDT ----- Regarding: RE: COVID + and needs to reschedule Monday pm or Tuesday is fine ----- Message ----- From: Lorayne Marek, RN Sent: 10/05/2022  11:10 AM EDT To: Henreitta Leber, MD Subject: COVID + and needs to reschedule                Dr. Barbaraann Cao, This patient is supposed to see you tomorrow. She tested COVID+ yesterday.  As for infection prevention protocols, we can see her on Monday 8/26 as long as she is no longer febrile and she wears a mask.  You have no open slots that day but you do have openings at 900 and 1100 on 8/27.  Please let us know what you would like to do.  Thanks! Lorayne Marek, RN

## 2022-10-11 ENCOUNTER — Inpatient Hospital Stay: Payer: 59 | Admitting: Internal Medicine

## 2022-10-11 ENCOUNTER — Inpatient Hospital Stay: Payer: 59 | Attending: Internal Medicine

## 2022-10-11 VITALS — BP 101/66 | HR 73 | Temp 98.0°F | Resp 17 | Ht 64.5 in | Wt 122.5 lb

## 2022-10-11 DIAGNOSIS — C714 Malignant neoplasm of occipital lobe: Secondary | ICD-10-CM | POA: Insufficient documentation

## 2022-10-11 DIAGNOSIS — H5347 Heteronymous bilateral field defects: Secondary | ICD-10-CM | POA: Diagnosis not present

## 2022-10-11 DIAGNOSIS — C719 Malignant neoplasm of brain, unspecified: Secondary | ICD-10-CM | POA: Diagnosis not present

## 2022-10-11 LAB — CBC WITH DIFFERENTIAL (CANCER CENTER ONLY)
Abs Immature Granulocytes: 0.01 10*3/uL (ref 0.00–0.07)
Basophils Absolute: 0 10*3/uL (ref 0.0–0.1)
Basophils Relative: 1 %
Eosinophils Absolute: 0.1 10*3/uL (ref 0.0–0.5)
Eosinophils Relative: 3 %
HCT: 36.5 % (ref 36.0–46.0)
Hemoglobin: 12.8 g/dL (ref 12.0–15.0)
Immature Granulocytes: 0 %
Lymphocytes Relative: 18 %
Lymphs Abs: 0.5 10*3/uL — ABNORMAL LOW (ref 0.7–4.0)
MCH: 32.5 pg (ref 26.0–34.0)
MCHC: 35.1 g/dL (ref 30.0–36.0)
MCV: 92.6 fL (ref 80.0–100.0)
Monocytes Absolute: 0.3 10*3/uL (ref 0.1–1.0)
Monocytes Relative: 11 %
Neutro Abs: 1.9 10*3/uL (ref 1.7–7.7)
Neutrophils Relative %: 67 %
Platelet Count: 121 10*3/uL — ABNORMAL LOW (ref 150–400)
RBC: 3.94 MIL/uL (ref 3.87–5.11)
RDW: 11.2 % — ABNORMAL LOW (ref 11.5–15.5)
WBC Count: 2.9 10*3/uL — ABNORMAL LOW (ref 4.0–10.5)
nRBC: 0 % (ref 0.0–0.2)

## 2022-10-11 LAB — CMP (CANCER CENTER ONLY)
ALT: 11 U/L (ref 0–44)
AST: 14 U/L — ABNORMAL LOW (ref 15–41)
Albumin: 4.1 g/dL (ref 3.5–5.0)
Alkaline Phosphatase: 46 U/L (ref 38–126)
Anion gap: 4 — ABNORMAL LOW (ref 5–15)
BUN: 17 mg/dL (ref 6–20)
CO2: 30 mmol/L (ref 22–32)
Calcium: 8.8 mg/dL — ABNORMAL LOW (ref 8.9–10.3)
Chloride: 106 mmol/L (ref 98–111)
Creatinine: 0.81 mg/dL (ref 0.44–1.00)
GFR, Estimated: >60 mL/min (ref >60)
Glucose, Bld: 84 mg/dL (ref 70–99)
Potassium: 4.2 mmol/L (ref 3.5–5.1)
Sodium: 140 mmol/L (ref 135–145)
Total Bilirubin: 0.8 mg/dL (ref 0.3–1.2)
Total Protein: 6.7 g/dL (ref 6.5–8.1)

## 2022-10-11 MED ORDER — TEMOZOLOMIDE 20 MG PO CAPS
80.0000 mg | ORAL_CAPSULE | Freq: Every day | ORAL | 0 refills | Status: AC
Start: 2022-10-11 — End: ?

## 2022-10-11 NOTE — Progress Notes (Signed)
[facility] [address]  [place], [place] [address] [phone]   Interval Evaluation  Date of Service: 10/11/22 Patient Name: Debbie Horton Patient MRN: [id number] Patient DOB: Oct 22, 1965 Provider: Henreitta Leber, MD  Identifying Statement:  Kacia Streifel is a 57 y.o. female with left occipital glioblastoma    Oncologic History: Oncology History  Glioblastoma, IDH-wildtype (HCC)  07/06/2021 Surgery   Left occipital craniotomy, resection with Dr. Jake Samples; path is Glioblastoma, IDHwt    - 09/24/2021 Radiation Therapy   Completes IMRT[facility]TMZ at [facility]   10/25/2021 -  Chemotherapy   Initiates cycle #1 adjuvant Temodar through [facility] team   11/23/2021 -  Chemotherapy   Patient is on Treatment Plan : BRAIN GLIOBLASTOMA Consolidation Temozolomide Days 1-5 q28 Days        Biomarkers:  MGMT Unknown.  IDH 1/2 Wild type.  EGFR Unknown  TERT Unknown   Interval History: Joya Sulkowski presents today for follow up, now having completed cycle #11 of metronomic daily Temodar.  She had COVID this past week but is now recovered.  Continues to tolerate treatment well.  Denies clinical changes.  Denies seizures.    H+P (07/22/21) Patient presented to medical attention last month with new onset headaches, blurry vision and episodes of confusion.  She obtained CNS imaging in the hospital which demonstrated an enhancing mass in the left occipital lobe.  She underwent craniotomy, resection with Dr. Jake Samples; path demonstrated glioblastoma IDHwt.  Following surgery, she has tapered off steroids.  She feels close to her baseline, aside from right visual field impairment, not improved.  She is fully functional, independent, and otherwise healthy.  Medications: Current Outpatient Medications on File Prior to Visit  Medication Sig Dispense Refill   acetaminophen (TYLENOL) 500 MG tablet Take 1,000 mg by mouth every 8 (eight) hours as needed  for headache.     Cholecalciferol (VITAMIN D-3 PO) Take 1 capsule by mouth daily.     cycloSPORINE (RESTASIS) 0.05 % ophthalmic emulsion Apply to eye.     estradiol (CLIMARA - DOSED IN MG/24 HR) 0.05 mg/24hr patch Place 0.05 mg onto the skin every Wednesday.     ibuprofen (ADVIL) 200 MG tablet Take 400 mg by mouth every 8 (eight) hours as needed for headache.     levocetirizine (XYZAL) 5 MG tablet Take 5 mg by mouth daily as needed (seasonal allergies).     Magnesium Gluconate 500 (27 Mg) MG TABS Take by mouth.     MAGNESIUM GLUCONATE PO Take 400 mg by mouth daily.     ondansetron (ZOFRAN) 8 MG tablet Take 1 tablet (8 mg total) by mouth every 8 (eight) hours as needed for nausea or vomiting. May take 30-60 minutes prior to Temodar administration if nausea/vomiting occurs as needed. 30 tablet 1   polyethylene glycol (MIRALAX / GLYCOLAX) 17 g packet Take 17 g by mouth daily as needed.     progesterone (PROMETRIUM) 200 MG capsule Take 200 mg by mouth at bedtime.     temozolomide (TEMODAR) 20 MG capsule TAKE 4 CAPSULES BY MOUTH DAILY 112 capsule 0   temozolomide (TEMODAR) 20 MG capsule Take 4 capsules (80 mg total) by mouth daily. 112 capsule 0   No current facility-administered medications on file prior to visit.    Allergies:  Allergies  Allergen Reactions   Shellfish-Derived Products Nausea Only   Latex Rash   Penicillins Rash   Phenergan [Promethazine] Palpitations   Sulfa Antibiotics Rash  Past Medical History: No past medical history on file. Past Surgical History:  Past Surgical History:  Procedure Laterality Date   APPLICATION OF CRANIAL NAVIGATION N/A 07/06/2021   Procedure: APPLICATION OF CRANIAL NAVIGATION;  Surgeon: Dawley, Alan Mulder, DO;  Location: [facility] OR;  Service: Neurosurgery;  Laterality: N/A;   CRANIOTOMY N/A 07/06/2021   Procedure: LEFT OCCIPITAL CRANIOTOMY FOR TUMOR EXCISION;  Surgeon: Dawley, Alan Mulder, DO;  Location: [facility] OR;  Service: Neurosurgery;  Laterality: N/A;    Social History:  Social History   Socioeconomic History   Marital status: Married    Spouse name: Not on file   Number of children: Not on file   Years of education: Not on file   Highest education level: Not on file  Occupational History   Occupation: [employer]  Tobacco Use   Smoking status: Never   Smokeless tobacco: Never  Vaping Use   Vaping status: Never Used  Substance and Sexual Activity   Alcohol use: Yes    Comment: occasional   Drug use: Never   Sexual activity: Yes    Birth control/protection: Post-menopausal  Other Topics Concern   Not on file  Social History Narrative   2 children (15 yr son and 81 yr daughter).   Social Determinants of Health   Financial Resource Strain: Not on file  Food Insecurity: Not on file  Transportation Needs: Not on file  Physical Activity: Not on file  Stress: Not on file  Social Connections: Not on file  Intimate Partner Violence: Not on file   Family History: No family history on file.  Review of Systems: Constitutional: Doesn't report fevers, chills or abnormal weight loss Eyes: Doesn't report blurriness of vision Ears, nose, mouth, throat, and face: Doesn't report sore throat Respiratory: Doesn't report cough, dyspnea or wheezes Cardiovascular: Doesn't report palpitation, chest discomfort  Gastrointestinal:  Doesn't report nausea, constipation, diarrhea GU: Doesn't report incontinence Skin: Doesn't report skin rashes Neurological: Per HPI Musculoskeletal: Doesn't report joint pain Behavioral/Psych: Doesn't report anxiety  Physical Exam: There were no vitals filed for this visit.   KPS: 80. General: Alert, cooperative, pleasant, in no acute distress Head: Normal EENT: No conjunctival injection or scleral icterus.  Lungs: Resp effort normal Cardiac: Regular rate Abdomen: Non-distended abdomen Skin: No rashes cyanosis or petechiae. Extremities: No clubbing or  edema  Neurologic Exam: Mental Status: Awake, alert, attentive to examiner. Oriented to self and environment. Language is fluent with intact comprehension.  Cranial Nerves: Visual acuity is grossly normal. R hemianopia. Extra-ocular movements intact. No ptosis. Face is symmetric Motor: Tone and bulk are normal. Power is full in both arms and legs. Reflexes are symmetric, no pathologic reflexes present.  Sensory: Intact to light touch Gait: Normal.   Labs: I have reviewed the data as listed    Component Value Date/Time   NA 139 09/08/2022 1208   K 4.3 09/08/2022 1208   CL 105 09/08/2022 1208   CO2 29 09/08/2022 1208   GLUCOSE 86 09/08/2022 1208   BUN 19 09/08/2022 1208   CREATININE 0.82 09/08/2022 1208   CALCIUM 9.4 09/08/2022 1208   PROT 6.4 (L) 09/08/2022 1208   ALBUMIN 4.4 09/08/2022 1208   AST 18 09/08/2022 1208   ALT 13 09/08/2022 1208   ALKPHOS 46 09/08/2022 1208   BILITOT 1.0 09/08/2022 1208   GFRNONAA >60 09/08/2022 1208   Lab Results  Component Value Date   WBC 3.5 (L) 09/08/2022   NEUTROABS 2.5 09/08/2022  HGB 12.4 09/08/2022   HCT 35.7 (L) 09/08/2022   MCV 93.2 09/08/2022   PLT 130 (L) 09/08/2022   Imaging:  [facility] Clinician Interpretation: I have personally reviewed the CNS images as listed.  My interpretation, in the context of the patient's clinical presentation, is stable disease pending official read  No results found.  Assessment/Plan Glioblastoma, IDH-wildtype (HCC)  Johnnette Litter Blitz is clinically stable today, now having completed cycle #11 daily metronomic Temodar.  MRI brain demonstrates stable findings, pending official read.  Recommended continuing cycle #12 daily metronomic Temozolomide 50 mg/m2. The patient will have a complete blood count performed on days 14 and 28 of each cycle, and a comprehensive metabolic panel performed on day 28 of each cycle. Labs may need to be performed more often. Zofran will prescribed for home use for  nausea/vomiting.   Chemotherapy should be held for the following:  ANC less than 1,000  Platelets less than 100,000  LFT or creatinine greater than 2x ULN  If clinical concerns/contraindications develop  Ophthalmologist is arranging for her to get prism glasses for hemianopia.  We ask that Jules Husbands return to clinic in 1 months with labs for review, or sooner as needed.  All questions were answered. The patient knows to call the clinic with any problems, questions or concerns. No barriers to learning were detected.  The total time spent in the encounter was 40 minutes and more than 50% was on counseling and review of test results   Henreitta Leber, MD Medical Director of Neuro-Oncology [facility] [place] [facility] 10/11/22 9:03 AM

## 2022-10-14 ENCOUNTER — Other Ambulatory Visit: Payer: Self-pay

## 2022-10-19 ENCOUNTER — Other Ambulatory Visit: Payer: Self-pay

## 2022-11-01 ENCOUNTER — Encounter: Payer: Self-pay | Admitting: Internal Medicine

## 2022-11-02 ENCOUNTER — Telehealth: Payer: Self-pay | Admitting: Internal Medicine

## 2022-11-03 ENCOUNTER — Other Ambulatory Visit: Payer: Self-pay

## 2022-11-08 ENCOUNTER — Other Ambulatory Visit: Payer: 59

## 2022-11-08 ENCOUNTER — Ambulatory Visit: Payer: 59 | Admitting: Internal Medicine

## 2022-11-10 ENCOUNTER — Inpatient Hospital Stay: Payer: 59 | Attending: Internal Medicine

## 2022-11-10 ENCOUNTER — Inpatient Hospital Stay: Payer: 59 | Admitting: Internal Medicine

## 2022-11-10 VITALS — BP 114/53 | HR 76 | Temp 97.7°F | Resp 20 | Wt 124.8 lb

## 2022-11-10 DIAGNOSIS — Z9221 Personal history of antineoplastic chemotherapy: Secondary | ICD-10-CM | POA: Insufficient documentation

## 2022-11-10 DIAGNOSIS — C719 Malignant neoplasm of brain, unspecified: Secondary | ICD-10-CM

## 2022-11-10 DIAGNOSIS — C714 Malignant neoplasm of occipital lobe: Secondary | ICD-10-CM | POA: Insufficient documentation

## 2022-11-10 DIAGNOSIS — H5347 Heteronymous bilateral field defects: Secondary | ICD-10-CM | POA: Diagnosis not present

## 2022-11-10 LAB — CBC WITH DIFFERENTIAL (CANCER CENTER ONLY)
Abs Immature Granulocytes: 0.01 10*3/uL (ref 0.00–0.07)
Basophils Absolute: 0 10*3/uL (ref 0.0–0.1)
Basophils Relative: 1 %
Eosinophils Absolute: 0.1 10*3/uL (ref 0.0–0.5)
Eosinophils Relative: 3 %
HCT: 36.5 % (ref 36.0–46.0)
Hemoglobin: 12.4 g/dL (ref 12.0–15.0)
Immature Granulocytes: 0 %
Lymphocytes Relative: 17 %
Lymphs Abs: 0.6 10*3/uL — ABNORMAL LOW (ref 0.7–4.0)
MCH: 32.4 pg (ref 26.0–34.0)
MCHC: 34 g/dL (ref 30.0–36.0)
MCV: 95.3 fL (ref 80.0–100.0)
Monocytes Absolute: 0.4 10*3/uL (ref 0.1–1.0)
Monocytes Relative: 11 %
Neutro Abs: 2.5 10*3/uL (ref 1.7–7.7)
Neutrophils Relative %: 68 %
Platelet Count: 141 10*3/uL — ABNORMAL LOW (ref 150–400)
RBC: 3.83 MIL/uL — ABNORMAL LOW (ref 3.87–5.11)
RDW: 12.1 % (ref 11.5–15.5)
WBC Count: 3.6 10*3/uL — ABNORMAL LOW (ref 4.0–10.5)
nRBC: 0 % (ref 0.0–0.2)

## 2022-11-10 LAB — CMP (CANCER CENTER ONLY)
ALT: 13 U/L (ref 0–44)
AST: 17 U/L (ref 15–41)
Albumin: 4.3 g/dL (ref 3.5–5.0)
Alkaline Phosphatase: 47 U/L (ref 38–126)
Anion gap: 5 (ref 5–15)
BUN: 18 mg/dL (ref 6–20)
CO2: 29 mmol/L (ref 22–32)
Calcium: 9 mg/dL (ref 8.9–10.3)
Chloride: 106 mmol/L (ref 98–111)
Creatinine: 0.95 mg/dL (ref 0.44–1.00)
GFR, Estimated: >60 mL/min (ref >60)
Glucose, Bld: 84 mg/dL (ref 70–99)
Potassium: 3.9 mmol/L (ref 3.5–5.1)
Sodium: 140 mmol/L (ref 135–145)
Total Bilirubin: 0.9 mg/dL (ref 0.3–1.2)
Total Protein: 6.9 g/dL (ref 6.5–8.1)

## 2022-11-10 NOTE — Progress Notes (Signed)
[facility] [address]  [place], [place] [address] [phone]   Interval Evaluation  Date of Service: 11/10/22 Patient Name: Debbie Horton Patient MRN: [id number] Patient DOB: May 13, 1965 Provider: Henreitta Leber, MD  Identifying Statement:  Debbie Horton is a 57 y.o. female with left occipital glioblastoma    Oncologic History: Oncology History  Glioblastoma, IDH-wildtype (HCC)  07/06/2021 Surgery   Left occipital craniotomy, resection with Dr. Jake Samples; path is Glioblastoma, IDHwt    - 09/24/2021 Radiation Therapy   Completes IMRT+TMZ at [facility]   10/25/2021 -  Chemotherapy   Initiates cycle #1 adjuvant Temodar through [facility] team   11/23/2021 -  Chemotherapy   Patient is on Treatment Plan : BRAIN GLIOBLASTOMA Consolidation Temozolomide Days 1-5 q28 Days        Biomarkers:  MGMT Unknown.  IDH 1/2 Wild type.  EGFR Unknown  TERT Unknown   Interval History: Sherma Mangold presents today for follow up, now having completed cycle #12 of metronomic daily Temodar.  No new or progressive changes.  Continues to tolerate treatment well.  Denies clinical changes.  Denies seizures.    H+P (07/22/21) Patient presented to medical attention last month with new onset headaches, blurry vision and episodes of confusion.  She obtained CNS imaging in the hospital which demonstrated an enhancing mass in the left occipital lobe.  She underwent craniotomy, resection with Dr. Jake Samples; path demonstrated glioblastoma IDHwt.  Following surgery, she has tapered off steroids.  She feels close to her baseline, aside from right visual field impairment, not improved.  She is fully functional, independent, and otherwise healthy.  Medications: Current Outpatient Medications on File Prior to Visit  Medication Sig Dispense Refill   acetaminophen (TYLENOL) 500 MG tablet Take 1,000 mg by mouth every 8 (eight) hours as needed for headache.      Cholecalciferol (VITAMIN D-3 PO) Take 1 capsule by mouth daily.     cycloSPORINE (RESTASIS) 0.05 % ophthalmic emulsion Apply to eye.     estradiol (CLIMARA - DOSED IN MG/24 HR) 0.05 mg/24hr patch Place 0.05 mg onto the skin every Wednesday.     ibuprofen (ADVIL) 200 MG tablet Take 400 mg by mouth every 8 (eight) hours as needed for headache.     levocetirizine (XYZAL) 5 MG tablet Take 5 mg by mouth daily as needed (seasonal allergies).     Magnesium Gluconate 500 (27 Mg) MG TABS Take by mouth.     MAGNESIUM GLUCONATE PO Take 400 mg by mouth daily.     ondansetron (ZOFRAN) 8 MG tablet Take 1 tablet (8 mg total) by mouth every 8 (eight) hours as needed for nausea or vomiting. May take 30-60 minutes prior to Temodar administration if nausea/vomiting occurs as needed. 30 tablet 1   polyethylene glycol (MIRALAX / GLYCOLAX) 17 g packet Take 17 g by mouth daily as needed.     progesterone (PROMETRIUM) 200 MG capsule Take 200 mg by mouth at bedtime.     temozolomide (TEMODAR) 20 MG capsule TAKE 4 CAPSULES BY MOUTH DAILY (Patient not taking: Reported on 10/11/2022) 112 capsule 0   temozolomide (TEMODAR) 20 MG capsule Take 4 capsules (80 mg total) by mouth daily. (Patient not taking: Reported on 10/11/2022) 112 capsule 0   temozolomide (TEMODAR) 20 MG capsule Take 4 capsules (80 mg total) by mouth daily. 112 capsule 0   No current facility-administered medications on file prior to visit.    Allergies:  Allergies  Allergen Reactions   Shellfish-Derived Products Nausea Only   Latex Rash   Penicillins Rash   Phenergan [Promethazine] Palpitations   Sulfa Antibiotics Rash   Past Medical History: No past medical history on file. Past Surgical History:  Past Surgical History:  Procedure Laterality Date   APPLICATION OF CRANIAL NAVIGATION N/A 07/06/2021   Procedure: APPLICATION OF CRANIAL NAVIGATION;  Surgeon: Dawley, Alan Mulder, DO;  Location: [facility] OR;  Service: Neurosurgery;  Laterality: N/A;   CRANIOTOMY N/A  07/06/2021   Procedure: LEFT OCCIPITAL CRANIOTOMY FOR TUMOR EXCISION;  Surgeon: Dawley, Alan Mulder, DO;  Location: [facility] OR;  Service: Neurosurgery;  Laterality: N/A;   Social History:  Social History   Socioeconomic History   Marital status: Married    Spouse name: Not on file   Number of children: Not on file   Years of education: Not on file   Highest education level: Not on file  Occupational History   Occupation: [employer]  Tobacco Use   Smoking status: Never   Smokeless tobacco: Never  Vaping Use   Vaping status: Never Used  Substance and Sexual Activity   Alcohol use: Yes    Comment: occasional   Drug use: Never   Sexual activity: Yes    Birth control/protection: Post-menopausal  Other Topics Concern   Not on file  Social History Narrative   2 children (15 yr son and 3 yr daughter).   Social Determinants of Health   Financial Resource Strain: Not on file  Food Insecurity: Not on file  Transportation Needs: Not on file  Physical Activity: Not on file  Stress: Not on file  Social Connections: Not on file  Intimate Partner Violence: Not on file   Family History: No family history on file.  Review of Systems: Constitutional: Doesn't report fevers, chills or abnormal weight loss Eyes: Doesn't report blurriness of vision Ears, nose, mouth, throat, and face: Doesn't report sore throat Respiratory: Doesn't report cough, dyspnea or wheezes Cardiovascular: Doesn't report palpitation, chest discomfort  Gastrointestinal:  Doesn't report nausea, constipation, diarrhea GU: Doesn't report incontinence Skin: Doesn't report skin rashes Neurological: Per HPI Musculoskeletal: Doesn't report joint pain Behavioral/Psych: Doesn't report anxiety  Physical Exam: Vitals:   11/10/22 1102  BP: (!) 114/53  Pulse: 76  Resp: 20  Temp: 97.7 F (36.5 C)  SpO2: 100%   KPS: 80. General: Alert, cooperative, pleasant, in no acute distress Head:  Normal EENT: No conjunctival injection or scleral icterus.  Lungs: Resp effort normal Cardiac: Regular rate Abdomen: Non-distended abdomen Skin: No rashes cyanosis or petechiae. Extremities: No clubbing or edema  Neurologic Exam: Mental Status: Awake, alert, attentive to examiner. Oriented to self and environment. Language is fluent with intact comprehension.  Cranial Nerves: Visual acuity is grossly normal. R hemianopia. Extra-ocular movements intact. No ptosis. Face is symmetric Motor: Tone and bulk are normal. Power is full in both arms and legs. Reflexes are symmetric, no pathologic reflexes present.  Sensory: Intact to light touch Gait: Normal.   Labs: I have reviewed the data as listed    Component Value Date/Time   NA 140 10/11/2022 0843   K 4.2 10/11/2022 0843   CL 106 10/11/2022 0843   CO2 30 10/11/2022 0843   GLUCOSE 84 10/11/2022 0843   BUN 17 10/11/2022 0843   CREATININE 0.81 10/11/2022 0843   CALCIUM 8.8 (L) 10/11/2022 0843   PROT 6.7 10/11/2022 0843   ALBUMIN 4.1 10/11/2022 0843   AST 14 (L) 10/11/2022 5621  ALT 11 10/11/2022 0843   ALKPHOS 46 10/11/2022 0843   BILITOT 0.8 10/11/2022 0843   GFRNONAA >60 10/11/2022 0843   Lab Results  Component Value Date   WBC 3.6 (L) 11/10/2022   NEUTROABS 2.5 11/10/2022   HGB 12.4 11/10/2022   HCT 36.5 11/10/2022   MCV 95.3 11/10/2022   PLT 141 (L) 11/10/2022    Assessment/Plan Glioblastoma, IDH-wildtype (HCC)  Jules Husbands is clinically stable today, now having completed cycle #12 daily metronomic Temodar.  No new or progressive changes.  Recommended stopping chemotherapy, now having completed full year of treatment.  Will obtain MRI brain in 1 month.  She will visit with [facility] team for treatment plan recommendation based on that study.  Ophthalmologist is arranging for her to get prism glasses for hemianopia.  We ask that Jules Husbands return to clinic as needed, pending input from [facility]  team.  All questions were answered. The patient knows to call the clinic with any problems, questions or concerns. No barriers to learning were detected.  The total time spent in the encounter was 30 minutes and more than 50% was on counseling and review of test results   Henreitta Leber, MD Medical Director of Neuro-Oncology [facility] 11/10/22 11:06 AM

## 2022-11-12 ENCOUNTER — Other Ambulatory Visit: Payer: Self-pay

## 2022-11-18 ENCOUNTER — Other Ambulatory Visit: Payer: Self-pay | Admitting: Internal Medicine

## 2022-11-18 DIAGNOSIS — C719 Malignant neoplasm of brain, unspecified: Secondary | ICD-10-CM

## 2022-12-09 ENCOUNTER — Ambulatory Visit (HOSPITAL_COMMUNITY)
Admission: RE | Admit: 2022-12-09 | Discharge: 2022-12-09 | Disposition: A | Discharge status: Home / Self Care | Payer: 59 | Source: Ambulatory Visit | Attending: Internal Medicine | Admitting: Internal Medicine

## 2022-12-09 DIAGNOSIS — C719 Malignant neoplasm of brain, unspecified: Secondary | ICD-10-CM | POA: Diagnosis present

## 2022-12-09 MED ORDER — GADOBUTROL 1 MMOL/ML IV SOLN
5.0000 mL | Freq: Once | INTRAVENOUS | Status: AC | PRN
  Administered 2022-12-09: 5 mL via INTRAVENOUS

## 2022-12-12 ENCOUNTER — Encounter: Payer: Self-pay | Admitting: *Deleted

## 2022-12-12 NOTE — Progress Notes (Signed)
Requested MRI Brain from 12/09/2022 to be pushed to [facility] for their review.

## 2022-12-13 ENCOUNTER — Telehealth: Payer: Self-pay | Admitting: *Deleted

## 2022-12-13 NOTE — Telephone Encounter (Signed)
Requested that MRI images from 12/09/22 be pushed to [facility] through [employer] [facility].

## 2022-12-15 ENCOUNTER — Inpatient Hospital Stay: Payer: 59 | Attending: Internal Medicine | Admitting: Internal Medicine

## 2022-12-15 VITALS — BP 105/65 | HR 90 | Temp 97.9°F | Resp 20 | Wt 126.0 lb

## 2022-12-15 DIAGNOSIS — C719 Malignant neoplasm of brain, unspecified: Secondary | ICD-10-CM

## 2022-12-15 DIAGNOSIS — C714 Malignant neoplasm of occipital lobe: Secondary | ICD-10-CM | POA: Insufficient documentation

## 2022-12-15 DIAGNOSIS — Z9221 Personal history of antineoplastic chemotherapy: Secondary | ICD-10-CM | POA: Insufficient documentation

## 2022-12-15 DIAGNOSIS — Z923 Personal history of irradiation: Secondary | ICD-10-CM | POA: Diagnosis not present

## 2022-12-15 NOTE — Progress Notes (Signed)
[facility] [address]  [place], [place] [address] [phone]   Interval Evaluation  Date of Service: 12/15/22 Patient Name: Debbie Horton Patient MRN: [id number] Patient DOB: 08/18/1965 Provider: Henreitta Leber, MD  Identifying Statement:  Bella Sabio is a 57 y.o. female with left occipital glioblastoma    Oncologic History: Oncology History  Glioblastoma, IDH-wildtype (HCC)  07/06/2021 Surgery   Left occipital craniotomy, resection with Dr. Jake Samples; path is Glioblastoma, IDHwt    - 09/24/2021 Radiation Therapy   Completes IMRT+TMZ at [facility]   10/25/2021 -  Chemotherapy   Initiates cycle #1 adjuvant Temodar through [facility] team   11/23/2021 -  Chemotherapy   Patient is on Treatment Plan : BRAIN GLIOBLASTOMA Consolidation Temozolomide Days 1-5 q28 Days        Biomarkers:  MGMT Unknown.  IDH 1/2 Wild type.  EGFR Unknown  TERT Unknown   Interval History: Jess Mote presents today for follow up, now having completed 12 cycles of metronomic daily Temodar, recent MRI brain.  No new or progressive changes.  Denies clinical changes aside from ongoing fatigue.  Denies seizures.    H+P (07/22/21) Patient presented to medical attention last month with new onset headaches, blurry vision and episodes of confusion.  She obtained CNS imaging in the hospital which demonstrated an enhancing mass in the left occipital lobe.  She underwent craniotomy, resection with Dr. Jake Samples; path demonstrated glioblastoma IDHwt.  Following surgery, she has tapered off steroids.  She feels close to her baseline, aside from right visual field impairment, not improved.  She is fully functional, independent, and otherwise healthy.  Medications: Current Outpatient Medications on File Prior to Visit  Medication Sig Dispense Refill   acetaminophen (TYLENOL) 500 MG tablet Take 1,000 mg by mouth every 8 (eight) hours as needed for headache.      Cholecalciferol (VITAMIN D-3 PO) Take 1 capsule by mouth daily.     cycloSPORINE (RESTASIS) 0.05 % ophthalmic emulsion Apply to eye.     estradiol (CLIMARA - DOSED IN MG/24 HR) 0.05 mg/24hr patch Place 0.05 mg onto the skin every Wednesday.     ibuprofen (ADVIL) 200 MG tablet Take 400 mg by mouth every 8 (eight) hours as needed for headache.     levocetirizine (XYZAL) 5 MG tablet Take 5 mg by mouth daily as needed (seasonal allergies).     Magnesium Gluconate 500 (27 Mg) MG TABS Take by mouth.     MAGNESIUM GLUCONATE PO Take 400 mg by mouth daily.     ondansetron (ZOFRAN) 8 MG tablet Take 1 tablet (8 mg total) by mouth every 8 (eight) hours as needed for nausea or vomiting. May take 30-60 minutes prior to Temodar administration if nausea/vomiting occurs as needed. 30 tablet 1   polyethylene glycol (MIRALAX / GLYCOLAX) 17 g packet Take 17 g by mouth daily as needed.     progesterone (PROMETRIUM) 200 MG capsule Take 200 mg by mouth at bedtime.     temozolomide (TEMODAR) 20 MG capsule TAKE 4 CAPSULES BY MOUTH DAILY (Patient not taking: Reported on 10/11/2022) 112 capsule 0   temozolomide (TEMODAR) 20 MG capsule Take 4 capsules (80 mg total) by mouth daily. (Patient not taking: Reported on 10/11/2022) 112 capsule 0   temozolomide (TEMODAR) 20 MG capsule Take 4 capsules (80 mg total) by mouth daily. (Patient not taking: Reported on 11/10/2022) 112 capsule 0   No current facility-administered medications on file prior to visit.  Allergies:  Allergies  Allergen Reactions   Shellfish-Derived Products Nausea Only   Latex Rash   Penicillins Rash   Phenergan [Promethazine] Palpitations   Sulfa Antibiotics Rash   Past Medical History: No past medical history on file. Past Surgical History:  Past Surgical History:  Procedure Laterality Date   APPLICATION OF CRANIAL NAVIGATION N/A 07/06/2021   Procedure: APPLICATION OF CRANIAL NAVIGATION;  Surgeon: Dawley, Alan Mulder, DO;  Location: [facility] OR;  Service:  Neurosurgery;  Laterality: N/A;   CRANIOTOMY N/A 07/06/2021   Procedure: LEFT OCCIPITAL CRANIOTOMY FOR TUMOR EXCISION;  Surgeon: Dawley, Alan Mulder, DO;  Location: [facility] OR;  Service: Neurosurgery;  Laterality: N/A;   Social History:  Social History   Socioeconomic History   Marital status: Married    Spouse name: Not on file   Number of children: Not on file   Years of education: Not on file   Highest education level: Not on file  Occupational History   Occupation: [employer]  Tobacco Use   Smoking status: Never   Smokeless tobacco: Never  Vaping Use   Vaping status: Never Used  Substance and Sexual Activity   Alcohol use: Yes    Comment: occasional   Drug use: Never   Sexual activity: Yes    Birth control/protection: Post-menopausal  Other Topics Concern   Not on file  Social History Narrative   2 children (15 yr son and 97 yr daughter).   Social Determinants of Health   Financial Resource Strain: Not on file  Food Insecurity: Not on file  Transportation Needs: Not on file  Physical Activity: Not on file  Stress: Not on file  Social Connections: Not on file  Intimate Partner Violence: Not on file   Family History: No family history on file.  Review of Systems: Constitutional: Doesn't report fevers, chills or abnormal weight loss Eyes: Doesn't report blurriness of vision Ears, nose, mouth, throat, and face: Doesn't report sore throat Respiratory: Doesn't report cough, dyspnea or wheezes Cardiovascular: Doesn't report palpitation, chest discomfort  Gastrointestinal:  Doesn't report nausea, constipation, diarrhea GU: Doesn't report incontinence Skin: Doesn't report skin rashes Neurological: Per HPI Musculoskeletal: Doesn't report joint pain Behavioral/Psych: Doesn't report anxiety  Physical Exam: Vitals:   12/15/22 0935  BP: 105/65  Pulse: 90  Resp: 20  Temp: 97.9 F (36.6 C)  SpO2: 100%    KPS: 80. General: Alert,  cooperative, pleasant, in no acute distress Head: Normal EENT: No conjunctival injection or scleral icterus.  Lungs: Resp effort normal Cardiac: Regular rate Abdomen: Non-distended abdomen Skin: No rashes cyanosis or petechiae. Extremities: No clubbing or edema  Neurologic Exam: Mental Status: Awake, alert, attentive to examiner. Oriented to self and environment. Language is fluent with intact comprehension.  Cranial Nerves: Visual acuity is grossly normal. R hemianopia. Extra-ocular movements intact. No ptosis. Face is symmetric Motor: Tone and bulk are normal. Power is full in both arms and legs. Reflexes are symmetric, no pathologic reflexes present.  Sensory: Intact to light touch Gait: Normal.   Labs: I have reviewed the data as listed    Component Value Date/Time   NA 140 11/10/2022 1052   K 3.9 11/10/2022 1052   CL 106 11/10/2022 1052   CO2 29 11/10/2022 1052   GLUCOSE 84 11/10/2022 1052   BUN 18 11/10/2022 1052   CREATININE 0.95 11/10/2022 1052   CALCIUM 9.0 11/10/2022 1052   PROT 6.9 11/10/2022 1052   ALBUMIN 4.3 11/10/2022 1052   AST 17  11/10/2022 1052   ALT 13 11/10/2022 1052   ALKPHOS 47 11/10/2022 1052   BILITOT 0.9 11/10/2022 1052   GFRNONAA >60 11/10/2022 1052   Lab Results  Component Value Date   WBC 3.6 (L) 11/10/2022   NEUTROABS 2.5 11/10/2022   HGB 12.4 11/10/2022   HCT 36.5 11/10/2022   MCV 95.3 11/10/2022   PLT 141 (L) 11/10/2022   Imaging:  [facility] Clinician Interpretation: I have personally reviewed the CNS images as listed.  My interpretation, in the context of the patient's clinical presentation, is stable disease  MR BRAIN W WO CONTRAST  Result Date: 12/09/2022 CLINICAL DATA:  Left occipital glioblastoma status post surgery in May 2023, radiation completed in August 2023. EXAM: MRI HEAD WITHOUT AND WITH CONTRAST TECHNIQUE: Multiplanar, multiecho pulse sequences of the brain and surrounding structures were obtained without and with  intravenous contrast. CONTRAST:  5mL GADAVIST GADOBUTROL 1 MMOL/ML IV SOLN COMPARISON:  Brain MRI most recently 09/30/2022 FINDINGS: Brain: Postsurgical changes reflecting left occipital mass resection are again seen. Encephalomalacia, gliosis, and chronic blood products are again seen in the underlying brain parenchyma. FLAIR signal abnormality surrounding the resection cavity extending anteriorly into the temporal lobe along the left lateral ventricle is not significantly changed. More superiorly, FLAIR signal abnormality in the left parietal white matter is also not significantly changed compared to the most recent prior study, but is increased compared to more remote studies such as the MRI from 04/04/2022. Minimal curvilinear enhancement at the periphery of the resection site is unchanged (16-70, 16-68). There is no new, progressive, or suspicious enhancement. A thin subdural collection underlying the craniotomy measuring 2 mm is unchanged. There is no new acute intracranial hemorrhage or extra-axial fluid collection there is no acute infarct. Parenchymal volume is stable. Parenchymal signal is otherwise essentially normal. The ventricles are stable in size. There is no mass effect or midline shift. Vascular: Normal flow voids. Skull and upper cervical spine: Postsurgical changes as above. There is no suspicious marrow signal abnormality. Sinuses/Orbits: The paranasal sinuses are clear. The globes and orbits are unremarkable. Other: The mastoid air cells and middle ear cavities are clear. IMPRESSION: Stable exam since 09/30/2022, with no new or progressive FLAIR signal abnormality or enhancement about the left occipital resection site. As noted on prior, the surrounding FLAIR signal abnormality has increased compared to more remote studies. Electronically Signed   By: Lesia Hausen M.D.   On: 12/09/2022 17:56     Assessment/Plan Glioblastoma, IDH-wildtype (HCC)  Johnnette Litter Merrow is clinically stable  today, now having completed 12 cycles of daily metronomic Temodar.  No new or progressive changes.  Will stop chemotherapy, now having completed full year of treatment, stable findings on MRI.  We ask that Jules Husbands return to clinic in 2 months with MRI brain for review.  All questions were answered. The patient knows to call the clinic with any problems, questions or concerns. No barriers to learning were detected.  The total time spent in the encounter was 30 minutes and more than 50% was on counseling and review of test results   Henreitta Leber, MD Medical Director of Neuro-Oncology [facility] 12/15/22 9:38 AM

## 2022-12-16 ENCOUNTER — Telehealth: Payer: Self-pay | Admitting: Internal Medicine

## 2022-12-16 NOTE — Telephone Encounter (Signed)
Spoke wth patient confirming upcoming appointment

## 2022-12-18 ENCOUNTER — Other Ambulatory Visit: Payer: Self-pay

## 2023-02-09 ENCOUNTER — Telehealth: Payer: Self-pay | Admitting: *Deleted

## 2023-02-09 ENCOUNTER — Ambulatory Visit (HOSPITAL_COMMUNITY)
Admission: RE | Admit: 2023-02-09 | Discharge: 2023-02-09 | Disposition: A | Discharge status: Home / Self Care | Payer: 59 | Source: Ambulatory Visit | Attending: Internal Medicine | Admitting: Internal Medicine

## 2023-02-09 DIAGNOSIS — C719 Malignant neoplasm of brain, unspecified: Secondary | ICD-10-CM | POA: Insufficient documentation

## 2023-02-09 MED ORDER — GADOBUTROL 1 MMOL/ML IV SOLN
6.0000 mL | Freq: Once | INTRAVENOUS | Status: AC | PRN
  Administered 2023-02-09: 6 mL via INTRAVENOUS

## 2023-02-09 NOTE — Telephone Encounter (Signed)
Requested that today's brain MRI images be pushed to [facility] by [employer] [facility].

## 2023-02-13 ENCOUNTER — Encounter: Payer: Self-pay | Admitting: *Deleted

## 2023-02-13 NOTE — Progress Notes (Signed)
Requested MRI be pushed to [facility].

## 2023-02-16 ENCOUNTER — Inpatient Hospital Stay: Payer: 59 | Attending: Internal Medicine | Admitting: Internal Medicine

## 2023-02-16 VITALS — BP 98/70 | HR 73 | Temp 98.0°F | Resp 15 | Ht 64.5 in | Wt 130.3 lb

## 2023-02-16 DIAGNOSIS — Z923 Personal history of irradiation: Secondary | ICD-10-CM | POA: Diagnosis not present

## 2023-02-16 DIAGNOSIS — Z9221 Personal history of antineoplastic chemotherapy: Secondary | ICD-10-CM | POA: Insufficient documentation

## 2023-02-16 DIAGNOSIS — C714 Malignant neoplasm of occipital lobe: Secondary | ICD-10-CM | POA: Insufficient documentation

## 2023-02-16 DIAGNOSIS — C719 Malignant neoplasm of brain, unspecified: Secondary | ICD-10-CM

## 2023-02-16 NOTE — Progress Notes (Signed)
 [facility] at [facility] [address]  [place], [place] [address] [phone]   Interval Evaluation  Date of Service: 02/16/23 Patient Name: Debbie Horton Patient MRN: [id number] Patient DOB: 1965/02/20 Provider: Arthea MARLA Manns, MD  Identifying Statement:  Debbie Horton is a 58 y.o. female with left occipital glioblastoma    Oncologic History: Oncology History  Glioblastoma, IDH-wildtype (HCC)  07/06/2021 Surgery   Left occipital craniotomy, resection with Dr. Carollee; path is Glioblastoma, IDHwt    - 09/24/2021 Radiation Therapy   Completes IMRT+TMZ at [facility]   10/25/2021 -  Chemotherapy   Initiates cycle #1 adjuvant Temodar  through [facility] team   11/23/2021 -  Chemotherapy   Patient is on Treatment Plan : BRAIN GLIOBLASTOMA Consolidation Temozolomide  Days 1-5 q28 Days        Biomarkers:  MGMT Unknown.  IDH 1/2 Wild type.  EGFR Unknown  TERT Unknown   Interval History: Debbie Horton presents today for follow up after recent MRI brain.  No new or progressive changes.  Denies clinical changes aside from ongoing fatigue.  Denies seizures.    H+P (07/22/21) Patient presented to medical attention last month with new onset headaches, blurry vision and episodes of confusion.  She obtained CNS imaging in the hospital which demonstrated an enhancing mass in the left occipital lobe.  She underwent craniotomy, resection with Dr. Carollee; path demonstrated glioblastoma IDHwt.  Following surgery, she has tapered off steroids.  She feels close to her baseline, aside from right visual field impairment, not improved.  She is fully functional, independent, and otherwise healthy.  Medications: Current Outpatient Medications on File Prior to Visit  Medication Sig Dispense Refill   acetaminophen  (TYLENOL ) 500 MG tablet Take 1,000 mg by mouth every 8 (eight) hours as needed for headache.     Cholecalciferol (VITAMIN D-3 PO) Take 1 capsule by  mouth daily.     cycloSPORINE (RESTASIS) 0.05 % ophthalmic emulsion Apply to eye.     estradiol  (CLIMARA  - DOSED IN MG/24 HR) 0.05 mg/24hr patch Place 0.05 mg onto the skin every Wednesday.     ibuprofen (ADVIL) 200 MG tablet Take 400 mg by mouth every 8 (eight) hours as needed for headache.     levocetirizine (XYZAL ) 5 MG tablet Take 5 mg by mouth daily as needed (seasonal allergies).     Magnesium Gluconate 500 (27 Mg) MG TABS Take by mouth.     MAGNESIUM GLUCONATE PO Take 400 mg by mouth daily.     ondansetron  (ZOFRAN ) 8 MG tablet Take 1 tablet (8 mg total) by mouth every 8 (eight) hours as needed for nausea or vomiting. May take 30-60 minutes prior to Temodar  administration if nausea/vomiting occurs as needed. 30 tablet 1   polyethylene glycol (MIRALAX / GLYCOLAX) 17 g packet Take 17 g by mouth daily as needed.     progesterone  (PROMETRIUM ) 200 MG capsule Take 200 mg by mouth at bedtime.     temozolomide  (TEMODAR ) 20 MG capsule TAKE 4 CAPSULES BY MOUTH DAILY (Patient not taking: Reported on 10/11/2022) 112 capsule 0   temozolomide  (TEMODAR ) 20 MG capsule Take 4 capsules (80 mg total) by mouth daily. (Patient not taking: Reported on 10/11/2022) 112 capsule 0   temozolomide  (TEMODAR ) 20 MG capsule Take 4 capsules (80 mg total) by mouth daily. (Patient not taking: Reported on 11/10/2022) 112 capsule 0   No current facility-administered medications on file prior to visit.    Allergies:  Allergies  Allergen  Reactions   Shellfish-Derived Products Nausea Only   Latex Rash   Penicillins Rash   Phenergan [Promethazine] Palpitations   Sulfa Antibiotics Rash   Past Medical History: No past medical history on file. Past Surgical History:  Past Surgical History:  Procedure Laterality Date   APPLICATION OF CRANIAL NAVIGATION N/A 07/06/2021   Procedure: APPLICATION OF CRANIAL NAVIGATION;  Surgeon: Dawley, Lani BROCKS, DO;  Location: [facility] OR;  Service: Neurosurgery;  Laterality: N/A;   CRANIOTOMY N/A  07/06/2021   Procedure: LEFT OCCIPITAL CRANIOTOMY FOR TUMOR EXCISION;  Surgeon: Dawley, Lani BROCKS, DO;  Location: [facility] OR;  Service: Neurosurgery;  Laterality: N/A;   Social History:  Social History   Socioeconomic History   Marital status: Married    Spouse name: Not on file   Number of children: Not on file   Years of education: Not on file   Highest education level: Not on file  Occupational History   Occupation: [employer]  Tobacco Use   Smoking status: Never   Smokeless tobacco: Never  Vaping Use   Vaping status: Never Used  Substance and Sexual Activity   Alcohol use: Yes    Comment: occasional   Drug use: Never   Sexual activity: Yes    Birth control/protection: Post-menopausal  Other Topics Concern   Not on file  Social History Narrative   2 children (15 yr son and 60 yr daughter).   Social Drivers of Corporate Investment Banker Strain: Not on file  Food Insecurity: Not on file  Transportation Needs: Not on file  Physical Activity: Not on file  Stress: Not on file  Social Connections: Not on file  Intimate Partner Violence: Not on file   Family History: No family history on file.  Review of Systems: Constitutional: Doesn't report fevers, chills or abnormal weight loss Eyes: Doesn't report blurriness of vision Ears, nose, mouth, throat, and face: Doesn't report sore throat Respiratory: Doesn't report cough, dyspnea or wheezes Cardiovascular: Doesn't report palpitation, chest discomfort  Gastrointestinal:  Doesn't report nausea, constipation, diarrhea GU: Doesn't report incontinence Skin: Doesn't report skin rashes Neurological: Per HPI Musculoskeletal: Doesn't report joint pain Behavioral/Psych: Doesn't report anxiety  Physical Exam: There were no vitals filed for this visit.   KPS: 80. General: Alert, cooperative, pleasant, in no acute distress Head: Normal EENT: No conjunctival injection or scleral icterus.  Lungs:  Resp effort normal Cardiac: Regular rate Abdomen: Non-distended abdomen Skin: No rashes cyanosis or petechiae. Extremities: No clubbing or edema  Neurologic Exam: Mental Status: Awake, alert, attentive to examiner. Oriented to self and environment. Language is fluent with intact comprehension.  Cranial Nerves: Visual acuity is grossly normal. R hemianopia. Extra-ocular movements intact. No ptosis. Face is symmetric Motor: Tone and bulk are normal. Power is full in both arms and legs. Reflexes are symmetric, no pathologic reflexes present.  Sensory: Intact to light touch Gait: Normal.   Labs: I have reviewed the data as listed    Component Value Date/Time   NA 140 11/10/2022 1052   K 3.9 11/10/2022 1052   CL 106 11/10/2022 1052   CO2 29 11/10/2022 1052   GLUCOSE 84 11/10/2022 1052   BUN 18 11/10/2022 1052   CREATININE 0.95 11/10/2022 1052   CALCIUM 9.0 11/10/2022 1052   PROT 6.9 11/10/2022 1052   ALBUMIN 4.3 11/10/2022 1052   AST 17 11/10/2022 1052   ALT 13 11/10/2022 1052   ALKPHOS 47 11/10/2022 1052   BILITOT 0.9 11/10/2022 1052  GFRNONAA >60 11/10/2022 1052   Lab Results  Component Value Date   WBC 3.6 (L) 11/10/2022   NEUTROABS 2.5 11/10/2022   HGB 12.4 11/10/2022   HCT 36.5 11/10/2022   MCV 95.3 11/10/2022   PLT 141 (L) 11/10/2022   Imaging:  [facility] Clinician Interpretation: I have personally reviewed the CNS images as listed.  My interpretation, in the context of the patient's clinical presentation, is stable disease  MR BRAIN W WO CONTRAST Result Date: 02/09/2023 CLINICAL DATA:  Provided history: Glioblastoma, IDH-wildtype. Brain/CNS neoplasm, assess treatment response. EXAM: MRI HEAD WITHOUT AND WITH CONTRAST TECHNIQUE: Multiplanar, multiecho pulse sequences of the brain and surrounding structures were obtained without and with intravenous contrast. CONTRAST:  6mL GADAVIST  GADOBUTROL  1 MMOL/ML IV SOLN COMPARISON:  Prior brain MRI examinations 12/09/2022 and  earlier. FINDINGS: Brain: Post-operative changes from prior left occipital lobe mass resection. Chronic blood products again noted within/about the resection cavity. T2 FLAIR hyperintense parenchymal signal abnormality surrounding the resection site, extending into the left temporal and left parietal lobes, unchanged from the prior MRI. A few small foci of curvilinear enhancement surrounding the resection cavity have also remained stable. No new mass effect is appreciated. No progressive signal abnormality or enhancement at the treatment site. Unchanged small subdural collection deep to the left parietooccipital cranioplasty, measuring 2 mm in thickness. Small nonspecific foci of T2 FLAIR hyperintense signal abnormality within the right frontal and right parietal white matter, unchanged and nonspecific. There is no acute infarct. No extra-axial fluid collection. No midline shift. Vascular: Maintained flow voids within the proximal large arterial vessels. Skull and upper cervical spine: Left parietooccipital cranioplasty. No focal worrisome marrow lesion. Sinuses/Orbits: No mass or acute finding within the imaged orbits. No significant paranasal sinus disease. Other: Trace fluid within the bilateral mastoid air cells. IMPRESSION: Stable examination as compared to the brain MRI of 12/09/2022. No progressive signal abnormality or enhancement surrounding the left occipital lobe resection site. No new mass effect. Electronically Signed   By: Rockey Childs D.O.   On: 02/09/2023 09:39     Assessment/Plan Glioblastoma, IDH-wildtype (HCC)  Rojelio Lee Lewing is clinically stable today, now having completed 12 cycles of daily metronomic Temodar .  No clinical changes today.  We ask that Rojelio Lee Cleotilde return to clinic as needed, based on needs of her primary team at [facility].  Will continue to order scans as needed.  All questions were answered. The patient knows to call the clinic with any problems, questions or  concerns. No barriers to learning were detected.  The total time spent in the encounter was 30 minutes and more than 50% was on counseling and review of test results   Arthea MARLA Manns, MD Medical Director of Neuro-Oncology [facility] at [place] 02/16/23 9:29 AM

## 2023-03-09 ENCOUNTER — Other Ambulatory Visit: Payer: Self-pay

## 2023-04-14 ENCOUNTER — Ambulatory Visit (HOSPITAL_COMMUNITY)
Admission: RE | Admit: 2023-04-14 | Discharge: 2023-04-14 | Disposition: A | Discharge status: Home / Self Care | Payer: 59 | Source: Ambulatory Visit | Attending: Internal Medicine | Admitting: Internal Medicine

## 2023-04-14 DIAGNOSIS — C719 Malignant neoplasm of brain, unspecified: Secondary | ICD-10-CM | POA: Diagnosis present

## 2023-04-14 MED ORDER — GADOBUTROL 1 MMOL/ML IV SOLN
5.0000 mL | Freq: Once | INTRAVENOUS | Status: AC | PRN
  Administered 2023-04-14: 5 mL via INTRAVENOUS

## 2023-04-17 ENCOUNTER — Encounter: Payer: Self-pay | Admitting: *Deleted

## 2023-04-17 NOTE — Progress Notes (Signed)
 Requested the IT push the MRI Brain dated 04/14/2023 to [facility].

## 2023-04-18 ENCOUNTER — Encounter: Payer: Self-pay | Admitting: Internal Medicine

## 2023-04-18 DIAGNOSIS — C719 Malignant neoplasm of brain, unspecified: Secondary | ICD-10-CM

## 2023-05-25 ENCOUNTER — Other Ambulatory Visit: Payer: Self-pay

## 2023-05-26 ENCOUNTER — Telehealth: Payer: Self-pay | Admitting: *Deleted

## 2023-05-26 NOTE — Telephone Encounter (Signed)
 Scheduling message sent to schedule F/U appointment with Dr Barbaraann Cao after MRI on 06/16/23.

## 2023-05-30 ENCOUNTER — Telehealth: Payer: Self-pay | Admitting: Internal Medicine

## 2023-05-30 NOTE — Telephone Encounter (Signed)
 Scheduled patient's appts. Advised patient to contact us if rescheduling is needed. Provided my direct line.

## 2023-05-31 ENCOUNTER — Other Ambulatory Visit: Payer: Self-pay

## 2023-05-31 ENCOUNTER — Telehealth: Payer: Self-pay | Admitting: Internal Medicine

## 2023-05-31 NOTE — Telephone Encounter (Signed)
 PATIENT RESCHEDULED

## 2023-06-01 ENCOUNTER — Other Ambulatory Visit: Payer: Self-pay

## 2023-06-16 ENCOUNTER — Ambulatory Visit (HOSPITAL_COMMUNITY)
Admission: RE | Admit: 2023-06-16 | Discharge: 2023-06-16 | Disposition: A | Discharge status: Home / Self Care | Source: Ambulatory Visit | Attending: Internal Medicine

## 2023-06-16 DIAGNOSIS — C719 Malignant neoplasm of brain, unspecified: Secondary | ICD-10-CM | POA: Diagnosis present

## 2023-06-16 MED ORDER — GADOBUTROL 1 MMOL/ML IV SOLN
6.0000 mL | Freq: Once | INTRAVENOUS | Status: AC | PRN
  Administered 2023-06-16: 6 mL via INTRAVENOUS

## 2023-06-20 ENCOUNTER — Telehealth: Payer: Self-pay | Admitting: *Deleted

## 2023-06-20 ENCOUNTER — Ambulatory Visit: Admitting: Internal Medicine

## 2023-06-20 NOTE — Telephone Encounter (Signed)
 Requested that MRI brain images from 06/16/23 be pushed to [facility] through [facility].

## 2023-06-22 ENCOUNTER — Inpatient Hospital Stay: Attending: Internal Medicine | Admitting: Internal Medicine

## 2023-06-22 VITALS — BP 109/65 | HR 85 | Temp 97.7°F | Resp 17 | Ht 64.5 in | Wt 131.0 lb

## 2023-06-22 DIAGNOSIS — Z9221 Personal history of antineoplastic chemotherapy: Secondary | ICD-10-CM | POA: Diagnosis not present

## 2023-06-22 DIAGNOSIS — C714 Malignant neoplasm of occipital lobe: Secondary | ICD-10-CM | POA: Diagnosis present

## 2023-06-22 DIAGNOSIS — Z923 Personal history of irradiation: Secondary | ICD-10-CM | POA: Insufficient documentation

## 2023-06-22 DIAGNOSIS — C719 Malignant neoplasm of brain, unspecified: Secondary | ICD-10-CM | POA: Diagnosis not present

## 2023-06-22 NOTE — Progress Notes (Signed)
 [facility] at [facility] [address]  [place], [place] [address] [phone]   Interval Evaluation  Date of Service: 06/22/23 Patient Name: Debbie Horton Patient MRN: [id number] Patient DOB: [birth year] Provider: Mamie Searles, MD  Identifying Statement:  Debbie Horton is a 58 y.o. female with left occipital glioblastoma    Oncologic History: Oncology History  Glioblastoma, IDH-wildtype (HCC)  07/06/2021 Surgery   Left occipital craniotomy, resection with Dr. Julane Ny; path is Glioblastoma, IDHwt    - 09/24/2021 Radiation Therapy   Completes IMRT[facility]TMZ at [facility]   10/25/2021 -  Chemotherapy   Initiates cycle #1 adjuvant Temodar  through [facility] team   11/23/2021 - 12/09/2022 Chemotherapy   Completes 12 cycles of 5-day Temodar      Biomarkers:  MGMT Unknown.  IDH 1/2 Wild type.  EGFR Unknown  TERT Unknown   Interval History: Debbie Horton presents today for follow up after recent MRI brain.  She denise any new or progressive deficits.  Denies clinical changes aside from ongoing fatigue.  No seizures or headaches.  H+P (07/22/21) Patient presented to medical attention last month with new onset headaches, blurry vision and episodes of confusion.  She obtained CNS imaging in the hospital which demonstrated an enhancing mass in the left occipital lobe.  She underwent craniotomy, resection with Dr. Julane Ny; path demonstrated glioblastoma IDHwt.  Following surgery, she has tapered off steroids.  She feels close to her baseline, aside from right visual field impairment, not improved.  She is fully functional, independent, and otherwise healthy.  Medications: Current Outpatient Medications on File Prior to Visit  Medication Sig Dispense Refill   acetaminophen  (TYLENOL ) 500 MG tablet Take 1,000 mg by mouth every 8 (eight) hours as needed for headache.     Cholecalciferol (VITAMIN D-3 PO) Take 1 capsule by mouth daily.     cycloSPORINE  (RESTASIS) 0.05 % ophthalmic emulsion Apply to eye.     estradiol  (CLIMARA  - DOSED IN MG/24 HR) 0.05 mg/24hr patch Place 0.05 mg onto the skin every Wednesday.     ibuprofen (ADVIL) 200 MG tablet Take 400 mg by mouth every 8 (eight) hours as needed for headache.     levocetirizine (XYZAL ) 5 MG tablet Take 5 mg by mouth daily as needed (seasonal allergies).     Magnesium Gluconate 500 (27 Mg) MG TABS Take by mouth.     MAGNESIUM GLUCONATE PO Take 400 mg by mouth daily.     ondansetron  (ZOFRAN ) 8 MG tablet Take 1 tablet (8 mg total) by mouth every 8 (eight) hours as needed for nausea or vomiting. May take 30-60 minutes prior to Temodar  administration if nausea/vomiting occurs as needed. 30 tablet 1   polyethylene glycol (MIRALAX / GLYCOLAX) 17 g packet Take 17 g by mouth daily as needed.     progesterone  (PROMETRIUM ) 200 MG capsule Take 200 mg by mouth at bedtime.     temozolomide  (TEMODAR ) 20 MG capsule TAKE 4 CAPSULES BY MOUTH DAILY 112 capsule 0   temozolomide  (TEMODAR ) 20 MG capsule Take 4 capsules (80 mg total) by mouth daily. 112 capsule 0   temozolomide  (TEMODAR ) 20 MG capsule Take 4 capsules (80 mg total) by mouth daily. (Patient not taking: Reported on 11/10/2022) 112 capsule 0   No current facility-administered medications on file prior to visit.    Allergies:  Allergies  Allergen Reactions   Shellfish-Derived Products Nausea Only   Latex Rash   Penicillins Rash   Phenergan [Promethazine] Palpitations  Sulfa Antibiotics Rash   Past Medical History: No past medical history on file. Past Surgical History:  Past Surgical History:  Procedure Laterality Date   APPLICATION OF CRANIAL NAVIGATION N/A 07/06/2021   Procedure: APPLICATION OF CRANIAL NAVIGATION;  Surgeon: Dawley, Colby Daub, DO;  Location: [facility] OR;  Service: Neurosurgery;  Laterality: N/A;   CRANIOTOMY N/A 07/06/2021   Procedure: LEFT OCCIPITAL CRANIOTOMY FOR TUMOR EXCISION;  Surgeon: Dawley, Colby Daub, DO;  Location: [facility] OR;   Service: Neurosurgery;  Laterality: N/A;   Social History:  Social History   Socioeconomic History   Marital status: Married    Spouse name: Not on file   Number of children: Not on file   Years of education: Not on file   Highest education level: Not on file  Occupational History   Occupation: [employer]  Tobacco Use   Smoking status: Never   Smokeless tobacco: Never  Vaping Use   Vaping status: Never Used  Substance and Sexual Activity   Alcohol use: Yes    Comment: occasional   Drug use: Never   Sexual activity: Yes    Birth control/protection: Post-menopausal  Other Topics Concern   Not on file  Social History Narrative   2 children (15 yr son and 25 yr daughter).   Social Drivers of Corporate investment banker Strain: Not on file  Food Insecurity: Not on file  Transportation Needs: Not on file  Physical Activity: Not on file  Stress: Not on file  Social Connections: Not on file  Intimate Partner Violence: Not on file   Family History: No family history on file.  Review of Systems: Constitutional: Doesn't report fevers, chills or abnormal weight loss Eyes: Doesn't report blurriness of vision Ears, nose, mouth, throat, and face: Doesn't report sore throat Respiratory: Doesn't report cough, dyspnea or wheezes Cardiovascular: Doesn't report palpitation, chest discomfort  Gastrointestinal:  Doesn't report nausea, constipation, diarrhea GU: Doesn't report incontinence Skin: Doesn't report skin rashes Neurological: Per HPI Musculoskeletal: Doesn't report joint pain Behavioral/Psych: Doesn't report anxiety  Physical Exam: There were no vitals filed for this visit.   KPS: 80. General: Alert, cooperative, pleasant, in no acute distress Head: Normal EENT: No conjunctival injection or scleral icterus.  Lungs: Resp effort normal Cardiac: Regular rate Abdomen: Non-distended abdomen Skin: No rashes cyanosis or  petechiae. Extremities: No clubbing or edema  Neurologic Exam: Mental Status: Awake, alert, attentive to examiner. Oriented to self and environment. Language is fluent with intact comprehension.  Cranial Nerves: Visual acuity is grossly normal. R hemianopia. Extra-ocular movements intact. No ptosis. Face is symmetric Motor: Tone and bulk are normal. Power is full in both arms and legs. Reflexes are symmetric, no pathologic reflexes present.  Sensory: Intact to light touch Gait: Normal.   Labs: I have reviewed the data as listed    Component Value Date/Time   NA 140 11/10/2022 1052   K 3.9 11/10/2022 1052   CL 106 11/10/2022 1052   CO2 29 11/10/2022 1052   GLUCOSE 84 11/10/2022 1052   BUN 18 11/10/2022 1052   CREATININE 0.95 11/10/2022 1052   CALCIUM 9.0 11/10/2022 1052   PROT 6.9 11/10/2022 1052   ALBUMIN 4.3 11/10/2022 1052   AST 17 11/10/2022 1052   ALT 13 11/10/2022 1052   ALKPHOS 47 11/10/2022 1052   BILITOT 0.9 11/10/2022 1052   GFRNONAA >60 11/10/2022 1052   Lab Results  Component Value Date   WBC 3.6 (L) 11/10/2022   NEUTROABS  2.5 11/10/2022   HGB 12.4 11/10/2022   HCT 36.5 11/10/2022   MCV 95.3 11/10/2022   PLT 141 (L) 11/10/2022   Imaging:  [facility] Clinician Interpretation: I have personally reviewed the CNS images as listed.  My interpretation, in the context of the patient's clinical presentation, is stable disease  MR BRAIN W WO CONTRAST Result Date: 06/21/2023 CLINICAL DATA:  Brain/CNS neoplasm. Assess treatment response. And GBM, IDH wild-type. EXAM: MRI HEAD WITHOUT AND WITH CONTRAST TECHNIQUE: Multiplanar, multiecho pulse sequences of the brain and surrounding structures were obtained without and with intravenous contrast. CONTRAST:  6mL GADAVIST  GADOBUTROL  1 MMOL/ML IV SOLN COMPARISON:  MR head without and with contrast 04/14/2023 and 02/09/2023. FINDINGS: Brain: Postoperative changes in the left occipital lobe are stable. Minimal enhancement along the  lateral margin of the resection cavity is stable. Surrounding T2 and FLAIR hyperintensities extending superiorly to the posterior corona radiata are stable. Minimal FLAIR hyperintensity posterior to the right lateral ventricle is stable. No new enhancement, restricted diffusion or T2 signal change is present. No acute infarct or hemorrhage is present. Vascular: Flow is present in the major intracranial arteries. Skull and upper cervical spine: The craniocervical junction is normal. Upper cervical spine is within normal limits. Marrow signal is unremarkable. Sinuses/Orbits: The paranasal sinuses and mastoid air cells are clear. The globes and orbits are within normal limits. IMPRESSION: 1. Stable postoperative changes in the left occipital lobe. 2. Stable surrounding T2 and FLAIR hyperintensities extending superiorly to the posterior corona radiata. 3. No new or progressive disease. Electronically Signed   By: Audree Leas M.D.   On: 06/21/2023 07:20     Assessment/Plan Glioblastoma, IDH-wildtype (HCC)  Debbie Horton is clinically stable today, now on imaging surveillance having completed 12 cycles of daily metronomic Temodar .  No clinical changes today.  We ask that Debbie Horton return to clinic as needed, based on needs of her primary team at [facility].  Will continue to order scans as needed.  All questions were answered. The patient knows to call the clinic with any problems, questions or concerns. No barriers to learning were detected.  The total time spent in the encounter was 30 minutes and more than 50% was on counseling and review of test results   Mamie Searles, MD Medical Director of Neuro-Oncology [facility] at [place] 06/22/23 9:36 AM

## 2023-07-05 ENCOUNTER — Encounter: Payer: Self-pay | Admitting: Internal Medicine

## 2023-07-12 ENCOUNTER — Other Ambulatory Visit: Payer: Self-pay

## 2023-08-25 ENCOUNTER — Encounter (HOSPITAL_COMMUNITY): Payer: Self-pay | Admitting: Radiology

## 2023-08-25 ENCOUNTER — Ambulatory Visit (HOSPITAL_COMMUNITY)
Admission: RE | Admit: 2023-08-25 | Discharge: 2023-08-25 | Disposition: A | Discharge status: Home / Self Care | Source: Ambulatory Visit | Attending: Internal Medicine

## 2023-08-25 DIAGNOSIS — C719 Malignant neoplasm of brain, unspecified: Secondary | ICD-10-CM | POA: Insufficient documentation

## 2023-08-25 MED ORDER — GADOBUTROL 1 MMOL/ML IV SOLN
6.0000 mL | Freq: Once | INTRAVENOUS | Status: AC | PRN
  Administered 2023-08-25: 6 mL via INTRAVENOUS

## 2023-08-26 NOTE — Progress Notes (Signed)
 [facility]       tel [phone]  fax [phone] website: [email or web address]  Interval Evaluation  Estee Yohe MRN: [id number] Visit Date: 08/28/2023   DOB: [birth year]   Age:  58 y.o. APP: [facility] KITCHIN  CONE, NP Attending Physician:  Victory Thedora Li, MD             Identifying Statement: Debbie Horton  is a 58 y.o. female from [place] [address] with a left occipital glioblastoma who presents for follow up.  MRI for comparison this visit: 12/09/22 MRI for comparison next visit: 08/28/23  ASSESSMENT & PLAN   1. 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 Shanigua Gibb is clinically and radiographically stable. She will remain off therapy and continue with close monitoring. Repeat imaging in 2 months with clinical evaluation shortly after.  Assessment & Plan Left occipital glioblastoma, off therapy since Octber 2024 No new lesions on imaging. Stable resection cavity. Managed cognitive function with minor fatigue-related concentration issues. - Continue scans every two months for the first year off therapy. - Discuss transition to three-month monitoring at September visit. - Survivorship issues addressed today: cognitive function, presence of sleep disturbances, mental health challenges, routine cancer surveillance, and healthy lifestyle counseling.  The above diagnoses, treatment plan, and options were discussed with the patient/family.  They have agreed to the above plan.  They were allowed to ask questions and these were answered to their satisfaction.  There were no barriers to understanding.  A high level of complex medical decision making was utilized for the medical management of this high risk patient. The evaluation and management  of this patient encompassed time counseling the patient/family (on disease symptoms, treatment/side effects and/or prognosis) and coordination of care.    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 MR brain revealed a left occipital mass concerning for high grade glioma.    07/06/2021 Surgery   Craniotomy for resection of left occipital lesion on 07/06/21 with Dr. 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 Candyce    10/25/2021 Clinical Event-Other   New patient evaluation at the [facility]. Recommend 5 day Temodar  150-200mg /m2.    10/27/2021 - 12/14/2021 Chemotherapy   2 cycles of 5-day Temodar     12/14/2021 Radiology Study   MRI with slightly increased extent of enhancement at medial aspect of resection cavity.    12/14/2021 - 10/2022 Chemotherapy   Daily Temodar  50mg /m2 (80mg  daily)    12/13/2022 Radiology Study   Stable disease. Initiate MRI monitoring off treatment.      The patient's disease status is up-to-date as described in the oncology history above.   INTERVAL HISTORY   History of Present Illness Trana Ressler is a 58 year old female who presents unaccompanied for a routine follow-up visit.  She has been off therapy since October 2024. She is feeling well, denies new or progressive neurologic symptoms. She offers no specific complaints today.  She is currently taking vitamin D3, Xyzal  5 mg daily, magnesium glycinate 200 mg, estradiol  patch, and progesterone  200 mg. She has discontinued Restasis drops. Her  sleep and mood are stable, and her cognitive function is good when well-rested.     HISTORY AND MEDICATIONS   Past Medical History:  Diagnosis Date  . In vitro fertilization    Past Surgical History:  Procedure Laterality Date  . DILATION & CURRETTAGE     Family History  Problem Relation Age of Onset  . Heart disease Mother   . Diabetes Mother   . Heart disease Father   . Alzheimer's disease Father   . Parkinsonism Father   . Lung cancer Maternal Uncle   . Lung cancer Paternal Uncle    Social History   Socioeconomic History  . Marital status: Married    Spouse name: Henli Hey  . Number of children: 2  . Highest education level: Bachelor's degree (e.g., BA, AB, BS)  Occupational History  . Occupation: Tree surgeon    Comment: [employer]  Tobacco Use  . Smoking status: Never  . Smokeless tobacco: Never  Vaping Use  . Vaping status: Never Used  Social History Armed forces logistics/support/administrative officer for [employer]   Married to [employer]   2 children, born 2004, 2007   Social Drivers of Health   Housing Stability: Unknown (04/17/2023)   Housing Stability Vital Sign   . Homeless in the Last Year: No    Birth Control Method: The current method of family planning is post menopausal status.No LMP recorded.  Allergies  Allergen Reactions  . Shellfish Derived Nausea  . Latex Rash  . Promethazine Palpitations  . Sulfa (Sulfonamide Antibiotics) Rash    Current Outpatient Medications  Medication Sig Dispense Refill  . cholecalciferol (VITAMIN D3) 2,000 unit tablet Take 2,000 Units by mouth once daily    . estradioL  (CLIMARA ) 0.1 mg/24 hr patch Place 1 patch onto the skin once a week    . levocetirizine (XYZAL ) 5 MG tablet Take 5 mg by mouth once daily    . magnesium glycinate 200 mg tablet Take 400 mg by mouth once daily    . progesterone  (PROMETRIUM ) 200 MG capsule TAKE 1 CAPSULE BY MOUTH EVERY DAY FOR 90 DAYS    . cycloSPORINE  (RESTASIS) 0.05 % ophthalmic emulsion Place 1 drop into both eyes 2 (two) times daily (Patient not taking: Reported on 08/28/2023)     No current facility-administered medications for this visit.     PHYSICAL EXAMINATION  BP 109/64 (BP Location: Right upper arm, Patient Position: Sitting, BP Cuff Size: Adult)   Pulse 83   Temp 36.6 C (97.9 F) (Oral)   Resp 18   Ht 163 cm (5' 4.17)   Wt 58.3 kg (128 lb 8.5 oz)   SpO2 98%   BMI 21.94 kg/m  Body mass index is 21.94 kg/m. Body surface area is 1.62 meters squared. Wt Readings from Last 3 Encounters:  08/28/23 58.3 kg (128 lb 8.5 oz)  06/20/23 58.1 kg (128 lb 1.4 oz)  04/18/23 57.6 kg (126 lb 15.8 oz)   KPS: 90  Able to carry on normal activity Distress Score:  (Patient-Rptd) (P) 6 General:Appears well, no distress HEENT: conjunctivae clear, sclerae anicteric, mucous membranes moist, and oropharynx clear Neck:no adenopathy and  supple with normal range of motion                      Cardiovascular:regular rhythm, normal S1 and S2, and no audible murmur Lungs / Chest:lungs clear to auscultation bilaterally, no rales, rhonchi, or wheezes, normal respiratory effort Extremities:no edema  Neurologic Exam Mental Status: alertness: alert, orientation: person, place, time, affect: normal, recall 3 out of 3.  She can spell a 5-letter word forwards and backwards and can perform calculations x 3. Speech: fluent. She identifies objects correctly.   Cranial nerves:  II: homonymous hemianopsia on the right,pupils equal, round, reactive to light and accommodation, no ptosis III/IV/VI: extra-ocular motions intact bilaterally V/VII:no evidence of facial droop or weakness and facial sensation intact VIII: hearing normal IX: soft palate elevation normal midline IX,X: gag reflex deferred XI: trapezius strength symmetric,  sternocleidomastoid strength symmetric XII: tongue strength symmetric   Motor:strength symmetric 5/5, normal muscle mass and  tone in all extremities and no pronator drift Sensory: intact to light touch Coordination: finger to nose Intact and symmetric and test for rapid alternating movements Intact and symmetric Gait: normal and tandem gait is normal.  LABS/STUDIES  Laboratory: None  Results RADIOLOGY MRI with contrast: Resection cavity unchanged, no new findings, completely normal (11/25/2022)   Imaging: [facility] MRI report pending    BTC MRI Interpretation: MRI from 08/28/23 was compared to MRI from 12/09/23 by Victory Thedora Li, MD  using actual scan.  Findings included stable FLAIR, no new enhancement and is consistent with stable disease.    CO-MANAGING PHYSICIANS  Patient Care Team: Provider, External Referred To as PCP - General Dawley, Lani BROCKS, DO (Neurosurgery) Candyce Ozell Lenis, MD (Radiation Oncology)  FUTURE APPOINTMENTS   Future Appointments     Date/Time Provider Department Center Visit Type   11/07/2023 9:00 AM (Arrive by 8:45 AM) Li Victory Thedora, MD [facility] RETURN VISIT        I spent a total of 35 minutes in both face-to-face and non-face-to-face activities for this visit on the date of this encounter.   CHRISTINA KITCHIN  CONE, NP   Attending Attestation:    I personally had a face to face encounter and performed a substantive portion of this E/M encounter in conjunction with the listed APP/NPP. I have reviewed the HPI and physical exam and agree with the findings. I reviewed the MRI and plan of care with the patient. All questions were answered to their satisfaction. I spent a total of 40 minutes in both face-to-face and non-face-to-face activities for this visit on the date of this encounter.   Victory RAMAN. Li, MD   This note has been created using automated tools and reviewed for accuracy by CHRISTINA KITCHIN  CONE

## 2023-08-28 ENCOUNTER — Encounter: Payer: Self-pay | Admitting: *Deleted

## 2023-08-28 NOTE — Progress Notes (Signed)
 Requested that images from MRI brain on 08/25/23 be pushed to [facility] through [employer] [facility].

## 2023-08-31 ENCOUNTER — Encounter: Payer: Self-pay | Admitting: Internal Medicine

## 2023-09-02 ENCOUNTER — Other Ambulatory Visit: Payer: Self-pay

## 2023-09-05 ENCOUNTER — Telehealth: Payer: Self-pay | Admitting: Internal Medicine

## 2023-09-06 ENCOUNTER — Other Ambulatory Visit: Payer: Self-pay

## 2023-09-07 ENCOUNTER — Encounter: Payer: Self-pay | Admitting: Internal Medicine

## 2023-09-07 ENCOUNTER — Inpatient Hospital Stay: Attending: Internal Medicine | Admitting: Internal Medicine

## 2023-09-07 DIAGNOSIS — C719 Malignant neoplasm of brain, unspecified: Secondary | ICD-10-CM | POA: Diagnosis not present

## 2023-09-07 NOTE — Progress Notes (Signed)
 I connected with Debbie Horton on 09/07/23 at  3:00 PM EDT by telephone visit and verified that I am speaking with the correct person using two identifiers.  I discussed the limitations, risks, security and privacy concerns of performing an evaluation and management service by telemedicine and the availability of in-person appointments. I also discussed with the patient that there may be a patient responsible charge related to this service. The patient expressed understanding and agreed to proceed.   Other persons participating in the visit and their role in the encounter:  n/a   Patient's location:  Home Provider's location:  Office Chief Complaint:  Glioblastoma, IDH-wildtype (HCC) - Plan: MR BRAIN W WO CONTRAST  History of Present Ilness: Debbie Horton reports no clinical changes today.  No issues with MRI study.  [facility].  Observations: Language and cognition at baseline  Imaging:  [facility] Clinician Interpretation: I have personally reviewed the CNS images as listed.  My interpretation, in the context of the patient's clinical presentation, is stable disease  MR BRAIN W WO CONTRAST Result Date: 08/25/2023 CLINICAL DATA:  Brain/CNS neoplasm, assess treatment response. History of glioblastoma, IDH wild-type. EXAM: MRI HEAD WITHOUT AND WITH CONTRAST TECHNIQUE: Multiplanar, multiecho pulse sequences of the brain and surrounding structures were obtained without and with intravenous contrast. CONTRAST:  6mL GADAVIST  GADOBUTROL  1 MMOL/ML IV SOLN COMPARISON:  MRI head 06/16/2023. FINDINGS: Brain: Redemonstrated postsurgical changes of left occipital craniotomy. Stable appearance of the underlying resection cavity. Minimal enhancement noted along the superior and lateral margins of the resection cavity centrally unchanged since 08/05/2022. T2/FLAIR signal abnormality surrounding the resection cavity as well as areas of susceptibility along the resection cavity are similar  to prior. No new enhancing lesions noted. No acute infarct. No midline shift. Posterior fossa is unremarkable. Normal appearance of midline structures. The basilar cisterns are patent. No extra-axial fluid collections. Ventricles: Stable size and configuration of the ventricles. Mild ex vacuo dilatation of the atrium and left occipital horn. Vascular: Skull base flow voids are visualized. Skull and upper cervical spine: Postsurgical changes of the calvarium. No acute or aggressive finding. The visualized upper cervical spine is unremarkable. Sinuses/Orbits: Orbits are symmetric. Paranasal sinuses are clear. Other: Mastoid air cells are clear. IMPRESSION: Similar postsurgical changes of left occipital craniotomy with similar appearance of the underlying resection cavity. Minimal enhancement along the resection cavity is unchanged from multiple prior studies. No new intracranial lesions. Electronically Signed   By: Donnice Mania M.D.   On: 08/25/2023 10:16   Assessment and Plan: Glioblastoma, IDH-wildtype (HCC) - Plan: MR BRAIN W WO CONTRAST  Clinically and radiographically stable  Follow Up Instructions: We ask that Debbie Horton return to clinic in 2 months following next brain MRI, or sooner as needed.  I discussed the assessment and treatment plan with the patient.  The patient was provided an opportunity to ask questions and all were answered.  The patient agreed with the plan and demonstrated understanding of the instructions.    The patient was advised to call back or seek an in-person evaluation if the symptoms worsen or if the condition fails to improve as anticipated.    Debbie Sidle K Milanni Ayub, MD   I provided 20 minutes of non face-to-face telephone visit time during this encounter, and > 50% was spent counseling as documented under my assessment & plan.

## 2023-09-12 ENCOUNTER — Other Ambulatory Visit: Payer: Self-pay

## 2023-09-20 ENCOUNTER — Other Ambulatory Visit: Payer: Self-pay

## 2023-11-03 ENCOUNTER — Ambulatory Visit (HOSPITAL_COMMUNITY)
Admission: RE | Admit: 2023-11-03 | Discharge: 2023-11-03 | Disposition: A | Discharge status: Home / Self Care | Source: Ambulatory Visit | Attending: Internal Medicine | Admitting: Internal Medicine

## 2023-11-03 DIAGNOSIS — C719 Malignant neoplasm of brain, unspecified: Secondary | ICD-10-CM | POA: Diagnosis present

## 2023-11-03 MED ORDER — GADOBUTROL 1 MMOL/ML IV SOLN
6.0000 mL | Freq: Once | INTRAVENOUS | Status: AC | PRN
  Administered 2023-11-03: 6 mL via INTRAVENOUS

## 2023-11-09 ENCOUNTER — Inpatient Hospital Stay: Attending: Internal Medicine | Admitting: Internal Medicine

## 2023-11-09 VITALS — BP 101/62 | HR 80 | Temp 98.4°F | Resp 16 | Ht 64.5 in | Wt 131.2 lb

## 2023-11-09 DIAGNOSIS — Z923 Personal history of irradiation: Secondary | ICD-10-CM | POA: Insufficient documentation

## 2023-11-09 DIAGNOSIS — Z9221 Personal history of antineoplastic chemotherapy: Secondary | ICD-10-CM | POA: Insufficient documentation

## 2023-11-09 DIAGNOSIS — C714 Malignant neoplasm of occipital lobe: Secondary | ICD-10-CM | POA: Diagnosis present

## 2023-11-09 DIAGNOSIS — C719 Malignant neoplasm of brain, unspecified: Secondary | ICD-10-CM

## 2023-11-09 NOTE — Progress Notes (Signed)
 [facility] [address]  [place], [place] [address] [phone]   Interval Evaluation  Date of Service: 11/09/23 Patient Name: Debbie Horton Patient MRN: [id number] Patient DOB: 11/25/1965 Provider: Arthea MARLA Manns, MD  Identifying Statement:  Debbie Horton is a 58 y.o. female with left occipital glioblastoma    Oncologic History: Oncology History  Glioblastoma, IDH-wildtype (HCC)  07/06/2021 Surgery   Left occipital craniotomy, resection with Dr. Carollee; path is Glioblastoma, IDHwt    - 09/24/2021 Radiation Therapy   Completes IMRT+TMZ at [facility]   10/25/2021 -  Chemotherapy   Initiates cycle #1 adjuvant Temodar  through [facility] team   11/23/2021 - 12/09/2022 Chemotherapy   Completes 12 cycles of 5-day Temodar      Biomarkers:  MGMT Unknown.  IDH 1/2 Wild type.  EGFR Unknown  TERT Unknown   Interval History: Debbie Horton presents today for follow up after recent MRI brain.  She denies any new or progressive deficits.  Denies clinical changes but fatigue has been mor of a problem especially with stressful days at work.  No seizures or headaches.  H+P (07/22/21) Patient presented to medical attention last month with new onset headaches, blurry vision and episodes of confusion.  She obtained CNS imaging in the hospital which demonstrated an enhancing mass in the left occipital lobe.  She underwent craniotomy, resection with Dr. Carollee; path demonstrated glioblastoma IDHwt.  Following surgery, she has tapered off steroids.  She feels close to her baseline, aside from right visual field impairment, not improved.  She is fully functional, independent, and otherwise healthy.  Medications: Current Outpatient Medications on File Prior to Visit  Medication Sig Dispense Refill   acetaminophen  (TYLENOL ) 500 MG tablet Take 1,000 mg by mouth every 8 (eight) hours as needed for headache.     Cholecalciferol (VITAMIN D-3  PO) Take 1 capsule by mouth daily.     cycloSPORINE (RESTASIS) 0.05 % ophthalmic emulsion Apply to eye.     estradiol  (CLIMARA  - DOSED IN MG/24 HR) 0.05 mg/24hr patch Place 0.05 mg onto the skin every Wednesday.     ibuprofen (ADVIL) 200 MG tablet Take 400 mg by mouth every 8 (eight) hours as needed for headache.     levocetirizine (XYZAL ) 5 MG tablet Take 5 mg by mouth daily as needed (seasonal allergies).     Magnesium Gluconate 500 (27 Mg) MG TABS Take by mouth.     MAGNESIUM GLUCONATE PO Take 400 mg by mouth daily.     ondansetron  (ZOFRAN ) 8 MG tablet Take 1 tablet (8 mg total) by mouth every 8 (eight) hours as needed for nausea or vomiting. May take 30-60 minutes prior to Temodar  administration if nausea/vomiting occurs as needed. 30 tablet 1   polyethylene glycol (MIRALAX / GLYCOLAX) 17 g packet Take 17 g by mouth daily as needed.     progesterone  (PROMETRIUM ) 200 MG capsule Take 200 mg by mouth at bedtime.     temozolomide  (TEMODAR ) 20 MG capsule TAKE 4 CAPSULES BY MOUTH DAILY 112 capsule 0   temozolomide  (TEMODAR ) 20 MG capsule Take 4 capsules (80 mg total) by mouth daily. 112 capsule 0   temozolomide  (TEMODAR ) 20 MG capsule Take 4 capsules (80 mg total) by mouth daily. (Patient not taking: Reported on 11/10/2022) 112 capsule 0   No current facility-administered medications on file prior to visit.    Allergies:  Allergies  Allergen Reactions   Shellfish-Derived Products Nausea Only   Latex Rash  Penicillins Rash   Phenergan [Promethazine] Palpitations   Sulfa Antibiotics Rash   Past Medical History: No past medical history on file. Past Surgical History:  Past Surgical History:  Procedure Laterality Date   APPLICATION OF CRANIAL NAVIGATION N/A 07/06/2021   Procedure: APPLICATION OF CRANIAL NAVIGATION;  Surgeon: Dawley, Lani BROCKS, DO;  Location: [facility] OR;  Service: Neurosurgery;  Laterality: N/A;   CRANIOTOMY N/A 07/06/2021   Procedure: LEFT OCCIPITAL CRANIOTOMY FOR TUMOR EXCISION;   Surgeon: Dawley, Lani BROCKS, DO;  Location: [facility] OR;  Service: Neurosurgery;  Laterality: N/A;   Social History:  Social History   Socioeconomic History   Marital status: Married    Spouse name: Not on file   Number of children: Not on file   Years of education: Not on file   Highest education level: Not on file  Occupational History   Occupation: [employer]  Tobacco Use   Smoking status: Never   Smokeless tobacco: Never  Vaping Use   Vaping status: Never Used  Substance and Sexual Activity   Alcohol use: Yes    Comment: occasional   Drug use: Never   Sexual activity: Yes    Birth control/protection: Post-menopausal  Other Topics Concern   Not on file  Social History Narrative   2 children (15 yr son and 36 yr daughter).   Social Drivers of Corporate investment banker Strain: Not on file  Food Insecurity: Not on file  Transportation Needs: Not on file  Physical Activity: Not on file  Stress: Not on file  Social Connections: Not on file  Intimate Partner Violence: Not on file   Family History: No family history on file.  Review of Systems: Constitutional: Doesn't report fevers, chills or abnormal weight loss Eyes: Doesn't report blurriness of vision Ears, nose, mouth, throat, and face: Doesn't report sore throat Respiratory: Doesn't report cough, dyspnea or wheezes Cardiovascular: Doesn't report palpitation, chest discomfort  Gastrointestinal:  Doesn't report nausea, constipation, diarrhea GU: Doesn't report incontinence Skin: Doesn't report skin rashes Neurological: Per HPI Musculoskeletal: Doesn't report joint pain Behavioral/Psych: Doesn't report anxiety  Physical Exam: Vitals:   11/09/23 0954  BP: 101/62  Pulse: 80  Resp: 16  Temp: 98.4 F (36.9 C)  SpO2: 100%   KPS: 80. General: Alert, cooperative, pleasant, in no acute distress Head: Normal EENT: No conjunctival injection or scleral icterus.  Lungs: Resp effort  normal Cardiac: Regular rate Abdomen: Non-distended abdomen Skin: No rashes cyanosis or petechiae. Extremities: No clubbing or edema  Neurologic Exam: Mental Status: Awake, alert, attentive to examiner. Oriented to self and environment. Language is fluent with intact comprehension.  Cranial Nerves: Visual acuity is grossly normal. R hemianopia. Extra-ocular movements intact. No ptosis. Face is symmetric Motor: Tone and bulk are normal. Power is full in both arms and legs. Reflexes are symmetric, no pathologic reflexes present.  Sensory: Intact to light touch Gait: Normal.   Labs: I have reviewed the data as listed    Component Value Date/Time   NA 140 11/10/2022 1052   K 3.9 11/10/2022 1052   CL 106 11/10/2022 1052   CO2 29 11/10/2022 1052   GLUCOSE 84 11/10/2022 1052   BUN 18 11/10/2022 1052   CREATININE 0.95 11/10/2022 1052   CALCIUM 9.0 11/10/2022 1052   PROT 6.9 11/10/2022 1052   ALBUMIN 4.3 11/10/2022 1052   AST 17 11/10/2022 1052   ALT 13 11/10/2022 1052   ALKPHOS 47 11/10/2022 1052   BILITOT 0.9 11/10/2022  1052   GFRNONAA >60 11/10/2022 1052   Lab Results  Component Value Date   WBC 3.6 (L) 11/10/2022   NEUTROABS 2.5 11/10/2022   HGB 12.4 11/10/2022   HCT 36.5 11/10/2022   MCV 95.3 11/10/2022   PLT 141 (L) 11/10/2022   Imaging:  [facility] Clinician Interpretation: I have personally reviewed the CNS images as listed.  My interpretation, in the context of the patient's clinical presentation, is treatment effect vs true progression  MR BRAIN W WO CONTRAST Result Date: 11/03/2023 EXAM: MRI BRAIN WITH AND WITHOUT CONTRAST 11/03/2023 08:47:57 AM TECHNIQUE: Multiplanar multisequence MRI of the head/brain was performed with and without the administration of intravenous contrast. COMPARISON: MRI brain 08/25/2023 CLINICAL HISTORY: Brain/CNS neoplasm, assess treatment response. Brain/CNS neoplasm, assess treatment response, Glioblastoma, IDH-wildtype. FINDINGS: BRAIN AND  VENTRICLES: No acute infarct. No acute intracranial hemorrhage. No mass effect or midline shift. No hydrocephalus. The sella is unremarkable. Normal flow voids. Postoperative appearance from prior left occipital craniotomy with underlying resection cavity in the left occipital lobe. Surrounding T2 hyperintensity is unchanged. New 5 mm somewhat nodular enhancement along the inferior aspect of the left occipital resection cavity best seen on coronal image 6 series 17. ORBITS: No acute abnormality. SINUSES: No acute abnormality. BONES AND SOFT TISSUES: Normal bone marrow signal and enhancement. No acute soft tissue abnormality. IMPRESSION: 1. Postoperative changes from prior left occipital craniotomy with underlying resection cavity in the left occipital lobe. New 5 mm somewhat nodular enhancement along the inferior aspect of the resection cavity. Short interval follow-up is recommended. Electronically signed by: Ryan Chess MD 11/03/2023 09:30 AM EDT RP Workstation: [id number]     Assessment/Plan Glioblastoma, IDH-wildtype (HCC)  Debbie Horton is clinically stable today, now on imaging surveillance having completed 12 cycles of daily metronomic Temodar .    MRI brain does demonstrates modest increase in contrast enhancement adjacent to the resection cavity and treatment site.  There is no mass effect or T2/FLAIR change accompanying this.  Etiology may be artifactual or secondary to treatment effect, but early recurrent tumor cannot be ruled out.  Would recommend continued imaging surveillance at present 2 month scheudule.  We ask that Debbie Horton return to clinic in 2 months following next brain MRI, or sooner as needed.  All questions were answered. The patient knows to call the clinic with any problems, questions or concerns. No barriers to learning were detected.  The total time spent in the encounter was 40 minutes and more than 50% was on counseling and review of test  results   Arthea MARLA Manns, MD Medical Director of Neuro-Oncology [facility] 11/09/23 9:55 AM

## 2023-11-10 ENCOUNTER — Telehealth: Payer: Self-pay | Admitting: Internal Medicine

## 2023-11-10 NOTE — Telephone Encounter (Signed)
 Scheduled patient for 8 week follow-up. Called and spoke with the patient, she is aware.

## 2023-11-12 ENCOUNTER — Other Ambulatory Visit: Payer: Self-pay

## 2023-12-29 ENCOUNTER — Ambulatory Visit (HOSPITAL_COMMUNITY)
Admission: RE | Admit: 2023-12-29 | Discharge: 2023-12-29 | Disposition: A | Discharge status: Home / Self Care | Source: Ambulatory Visit | Attending: Internal Medicine | Admitting: Internal Medicine

## 2023-12-29 DIAGNOSIS — C719 Malignant neoplasm of brain, unspecified: Secondary | ICD-10-CM | POA: Insufficient documentation

## 2023-12-29 MED ORDER — GADOBUTROL 1 MMOL/ML IV SOLN
6.0000 mL | Freq: Once | INTRAVENOUS | Status: AC | PRN
  Administered 2023-12-29: 6 mL via INTRAVENOUS

## 2024-01-02 ENCOUNTER — Inpatient Hospital Stay: Attending: Internal Medicine | Admitting: Internal Medicine

## 2024-01-02 VITALS — BP 108/65 | HR 86 | Temp 97.7°F | Resp 17 | Ht 64.5 in | Wt 133.0 lb

## 2024-01-02 DIAGNOSIS — C714 Malignant neoplasm of occipital lobe: Secondary | ICD-10-CM | POA: Insufficient documentation

## 2024-01-02 DIAGNOSIS — C719 Malignant neoplasm of brain, unspecified: Secondary | ICD-10-CM

## 2024-01-02 DIAGNOSIS — Z923 Personal history of irradiation: Secondary | ICD-10-CM | POA: Diagnosis not present

## 2024-01-02 DIAGNOSIS — Z9221 Personal history of antineoplastic chemotherapy: Secondary | ICD-10-CM | POA: Insufficient documentation

## 2024-01-02 MED ORDER — DEXAMETHASONE 4 MG PO TABS: 4.0000 mg | ORAL_TABLET | Freq: Every day | ORAL | 0 refills | Status: AC

## 2024-01-02 NOTE — Progress Notes (Signed)
 Requested that images from MRI brain on 12/29/23 be pushed to [facility] through [facility].  Email to [employer].

## 2024-01-02 NOTE — Progress Notes (Signed)
 [facility] at [facility] [address]  [place], [place] [address] [phone]   Interval Evaluation  Date of Service: 01/02/24 Patient Name: Debbie Horton Patient MRN: [id number] Patient DOB: March 08, 1965 Provider: Arthea MARLA Manns, MD  Identifying Statement:  Debbie Horton is a 58 y.o. female with left occipital glioblastoma    Oncologic History: Oncology History  Glioblastoma, IDH-wildtype (HCC)  07/06/2021 Surgery   Left occipital craniotomy, resection with Dr. Carollee; path is Glioblastoma, IDHwt    - 09/24/2021 Radiation Therapy   Completes IMRT+TMZ at [facility]   10/25/2021 -  Chemotherapy   Initiates cycle #1 adjuvant Temodar  through [facility] team   11/23/2021 - 12/09/2022 Chemotherapy   Completes 12 cycles of 5-day Temodar      Biomarkers:  MGMT Unknown.  IDH 1/2 Wild type.  EGFR Unknown  TERT Unknown   Interval History: Debbie Horton presents today for follow up after recent MRI brain.  No new or progressive changes today.  Denies clinical changes but fatigue has been mor of a problem especially with stressful days at work.  No seizures or headaches.  H+P (07/22/21) Patient presented to medical attention last month with new onset headaches, blurry vision and episodes of confusion.  She obtained CNS imaging in the hospital which demonstrated an enhancing mass in the left occipital lobe.  She underwent craniotomy, resection with Dr. Carollee; path demonstrated glioblastoma IDHwt.  Following surgery, she has tapered off steroids.  She feels close to her baseline, aside from right visual field impairment, not improved.  She is fully functional, independent, and otherwise healthy.  Medications: Current Outpatient Medications on File Prior to Visit  Medication Sig Dispense Refill   acetaminophen  (TYLENOL ) 500 MG tablet Take 1,000 mg by mouth every 8 (eight) hours as needed for headache.     Cholecalciferol (VITAMIN D-3 PO) Take  1 capsule by mouth daily.     estradiol  (CLIMARA  - DOSED IN MG/24 HR) 0.05 mg/24hr patch Place 0.05 mg onto the skin every Wednesday.     ibuprofen (ADVIL) 200 MG tablet Take 400 mg by mouth every 8 (eight) hours as needed for headache.     levocetirizine (XYZAL ) 5 MG tablet Take 5 mg by mouth daily as needed (seasonal allergies).     Magnesium Gluconate 500 (27 Mg) MG TABS Take by mouth.     MAGNESIUM GLUCONATE PO Take 400 mg by mouth daily.     ondansetron  (ZOFRAN ) 8 MG tablet Take 1 tablet (8 mg total) by mouth every 8 (eight) hours as needed for nausea or vomiting. May take 30-60 minutes prior to Temodar  administration if nausea/vomiting occurs as needed. 30 tablet 1   polyethylene glycol (MIRALAX / GLYCOLAX) 17 g packet Take 17 g by mouth daily as needed.     progesterone  (PROMETRIUM ) 200 MG capsule Take 200 mg by mouth at bedtime.     temozolomide  (TEMODAR ) 20 MG capsule Take 4 capsules (80 mg total) by mouth daily. (Patient not taking: Reported on 11/10/2022) 112 capsule 0   No current facility-administered medications on file prior to visit.    Allergies:  Allergies  Allergen Reactions   Shellfish Protein-Containing Drug Products Nausea Only   Latex Rash   Penicillins Rash   Phenergan [Promethazine] Palpitations   Sulfa Antibiotics Rash   Past Medical History: No past medical history on file. Past Surgical History:  Past Surgical History:  Procedure Laterality Date   APPLICATION OF CRANIAL NAVIGATION N/A 07/06/2021   Procedure:  APPLICATION OF CRANIAL NAVIGATION;  Surgeon: Dawley, Lani BROCKS, DO;  Location: [facility] OR;  Service: Neurosurgery;  Laterality: N/A;   CRANIOTOMY N/A 07/06/2021   Procedure: LEFT OCCIPITAL CRANIOTOMY FOR TUMOR EXCISION;  Surgeon: Dawley, Lani BROCKS, DO;  Location: [facility] OR;  Service: Neurosurgery;  Laterality: N/A;   Social History:  Social History   Socioeconomic History   Marital status: Married    Spouse name: Not on file   Number of children: Not on file    Years of education: Not on file   Highest education level: Not on file  Occupational History   Occupation: [employer]  Tobacco Use   Smoking status: Never   Smokeless tobacco: Never  Vaping Use   Vaping status: Never Used  Substance and Sexual Activity   Alcohol use: Yes    Comment: occasional   Drug use: Never   Sexual activity: Yes    Birth control/protection: Post-menopausal  Other Topics Concern   Not on file  Social History Narrative   2 children (15 yr son and 72 yr daughter).   Social Drivers of Corporate Investment Banker Strain: Not on file  Food Insecurity: Not on file  Transportation Needs: Not on file  Physical Activity: Not on file  Stress: Not on file  Social Connections: Not on file  Intimate Partner Violence: Not on file   Family History: No family history on file.  Review of Systems: Constitutional: Doesn't report fevers, chills or abnormal weight loss Eyes: Doesn't report blurriness of vision Ears, nose, mouth, throat, and face: Doesn't report sore throat Respiratory: Doesn't report cough, dyspnea or wheezes Cardiovascular: Doesn't report palpitation, chest discomfort  Gastrointestinal:  Doesn't report nausea, constipation, diarrhea GU: Doesn't report incontinence Skin: Doesn't report skin rashes Neurological: Per HPI Musculoskeletal: Doesn't report joint pain Behavioral/Psych: Doesn't report anxiety  Physical Exam: Vitals:   01/02/24 1005  BP: 108/65  Pulse: 86  Resp: 17  Temp: 97.7 F (36.5 C)  SpO2: 100%   KPS: 80. General: Alert, cooperative, pleasant, in no acute distress Head: Normal EENT: No conjunctival injection or scleral icterus.  Lungs: Resp effort normal Cardiac: Regular rate Abdomen: Non-distended abdomen Skin: No rashes cyanosis or petechiae. Extremities: No clubbing or edema  Neurologic Exam: Mental Status: Awake, alert, attentive to examiner. Oriented to self and environment.  Language is fluent with intact comprehension.  Cranial Nerves: Visual acuity is grossly normal. R hemianopia. Extra-ocular movements intact. No ptosis. Face is symmetric Motor: Tone and bulk are normal. Power is full in both arms and legs. Reflexes are symmetric, no pathologic reflexes present.  Sensory: Intact to light touch Gait: Normal.   Labs: I have reviewed the data as listed    Component Value Date/Time   NA 140 11/10/2022 1052   K 3.9 11/10/2022 1052   CL 106 11/10/2022 1052   CO2 29 11/10/2022 1052   GLUCOSE 84 11/10/2022 1052   BUN 18 11/10/2022 1052   CREATININE 0.95 11/10/2022 1052   CALCIUM 9.0 11/10/2022 1052   PROT 6.9 11/10/2022 1052   ALBUMIN 4.3 11/10/2022 1052   AST 17 11/10/2022 1052   ALT 13 11/10/2022 1052   ALKPHOS 47 11/10/2022 1052   BILITOT 0.9 11/10/2022 1052   GFRNONAA >60 11/10/2022 1052   Lab Results  Component Value Date   WBC 3.6 (L) 11/10/2022   NEUTROABS 2.5 11/10/2022   HGB 12.4 11/10/2022   HCT 36.5 11/10/2022   MCV 95.3 11/10/2022   PLT 141 (  L) 11/10/2022   Imaging:  [facility] Clinician Interpretation: I have personally reviewed the CNS images as listed.  My interpretation, in the context of the patient's clinical presentation, is treatment effect vs true progression  MR BRAIN W WO CONTRAST Result Date: 12/29/2023 EXAM: MRI BRAIN WITH AND WITHOUT CONTRAST 12/29/2023 09:25:18 AM TECHNIQUE: Multiplanar multisequence MRI of the head/brain was performed with and without the administration of intravenous contrast. COMPARISON: MRI brain with temporary 19-20/25 and numerous prior brain MRIs. CLINICAL HISTORY: Brain/CNS neoplasm, assess treatment response. FINDINGS: BRAIN AND VENTRICLES: There are postsurgical changes relating to prior left occipital craniotomy and mass resection. Overall similar 0.4 cm extra-axial fluid collection subjacent to the craniotomy site. There is ex-vacuo dilatation of the left occipital horn and atrium of the left lateral  ventricle. There is overall similar T2/FLAIR hyperintensity surrounding the resection site. There is overall similar susceptibility lining the resection cavity in keeping with chronic blood products. There is increased curvilinear enhancement at the resection site compared to prior exam (series 16, image 65). There is incomplete T2 signal suppression on FLAIR sequence within the resection cavity fluid (series 9, image 22), in contrast to prior exams. No acute infarct. No acute intracranial hemorrhage. No significant mass effect or midline shift. There is mild background subcortical and periventricular T2 and FLAIR signal hyperintensity likely reflecting sequelae of chronic microvascular ischemia. No hydrocephalus. The sella is unremarkable. Normal flow voids. ORBITS: No acute abnormality. SINUSES: Again trace right mastoid effusion. BONES AND SOFT TISSUES: Normal bone marrow signal and enhancement. No acute soft tissue abnormality. IMPRESSION: 1. Since 11/03/2023, increased curvilinear enhancement at the resection cavity as well as increased signal within the resection cavity fluid; taken together these findings are suspicious for tumor progression, though a component of treatment-related change is not excluded. 2. Overall similar signal abnormality surrounding the resection site. 3. No acute intracranial hemorrhage, acute infarction, or significant mass effect. Electronically signed by: prentice spade 12/29/2023 10:46 AM EST RP Workstation: [id number]     Assessment/Plan No diagnosis found.  Kada Friesen is clinically stable today, now on imaging surveillance having completed 12 cycles of daily metronomic Temodar .    MRI brain does demonstrates further increase in contrast enhancement adjacent to the resection cavity and treatment site.  There is again no mass effect or T2/FLAIR change accompanying this.  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    We discussed various options  moving forward, including biopsy or resection, resumption or metronomic Temodar , continued surveillance. We ultimately recommended repeat MRI brain in 1 month.  Will pre-treat with 10 days of decadron  4mg  daily.  If further progression is noted, it might be reasonable to obtain tissue, or stratify for a clinical trial. We will also reach out to her [facility] team and are happy to implement any treatment plan they recommend.  We ask that Rojelio Lennie Pinal return to clinic in 1months following next brain MRI, or sooner as needed.  All questions were answered. The patient knows to call the clinic with any problems, questions or concerns. No barriers to learning were detected.  The total time spent in the encounter was 40 minutes and more than 50% was on counseling and review of test results   Arthea MARLA Manns, MD Medical Director of Neuro-Oncology [facility] 01/02/24 10:27 AM

## 2024-01-03 ENCOUNTER — Telehealth: Payer: Self-pay | Admitting: Internal Medicine

## 2024-01-03 NOTE — Telephone Encounter (Signed)
 Scheduled patient for next appointment. Called and spoke with the patient, she is aware.

## 2024-01-04 ENCOUNTER — Other Ambulatory Visit: Payer: Self-pay

## 2024-01-04 ENCOUNTER — Encounter: Payer: Self-pay | Admitting: Internal Medicine

## 2024-01-04 ENCOUNTER — Ambulatory Visit: Admitting: Internal Medicine

## 2024-01-25 ENCOUNTER — Encounter: Payer: Self-pay | Admitting: Internal Medicine

## 2024-01-26 ENCOUNTER — Ambulatory Visit (HOSPITAL_COMMUNITY): Admission: RE | Admit: 2024-01-26 | Discharge: 2024-01-26 | Attending: Internal Medicine

## 2024-01-26 DIAGNOSIS — C719 Malignant neoplasm of brain, unspecified: Secondary | ICD-10-CM

## 2024-01-26 MED ORDER — GADOBUTROL 1 MMOL/ML IV SOLN
6.0000 mL | Freq: Once | INTRAVENOUS | Status: AC | PRN
  Administered 2024-01-26: 6 mL via INTRAVENOUS

## 2024-01-30 ENCOUNTER — Encounter: Payer: Self-pay | Admitting: *Deleted

## 2024-02-01 ENCOUNTER — Inpatient Hospital Stay: Admitting: Internal Medicine

## 2024-02-14 ENCOUNTER — Other Ambulatory Visit: Payer: Self-pay

## 2024-02-20 ENCOUNTER — Other Ambulatory Visit: Payer: Self-pay

## 2024-02-23 ENCOUNTER — Other Ambulatory Visit: Payer: Self-pay

## 2024-03-05 ENCOUNTER — Encounter: Payer: Self-pay | Admitting: *Deleted

## 2024-03-05 NOTE — Progress Notes (Signed)
 Received office note from [facility] regarding encounter  02/20/2024 with Dr Saturnino.  Patient elected to proceed with [id number]: A Phase 1 Trial of D3C7-IT in combination with an Fc-engineered Anti-CD40 Monoclonal Antibody (2141-V11).  Administered intratumorally via Convection-Enchanged Delivery Followed by Perilymphatic injections of 2141-V11 and Assessment of the [facility] Monorail for Adult Patients with Recurrent Malignant Glioma (Subgroup 3- Monorail).

## 2024-03-07 ENCOUNTER — Other Ambulatory Visit: Payer: Self-pay

## 2024-03-15 ENCOUNTER — Other Ambulatory Visit: Payer: Self-pay
